# Patient Record
Sex: Female | Born: 1985 | Race: Black or African American | Hispanic: No | Marital: Single | Smoking: Never smoker
Health system: Southern US, Community
[De-identification: ages and names within clinical notes are randomized; demographics above are authoritative.]

## PROBLEM LIST (undated history)

## (undated) DIAGNOSIS — T7840XA Allergy, unspecified, initial encounter: Secondary | ICD-10-CM

## (undated) DIAGNOSIS — J45909 Unspecified asthma, uncomplicated: Secondary | ICD-10-CM

## (undated) DIAGNOSIS — L309 Dermatitis, unspecified: Secondary | ICD-10-CM

## (undated) HISTORY — PX: OTHER SURGICAL HISTORY: SHX169

## (undated) HISTORY — DX: Allergy, unspecified, initial encounter: T78.40XA

## (undated) HISTORY — DX: Unspecified asthma, uncomplicated: J45.909

---
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
## 2006-11-24 ENCOUNTER — Inpatient Hospital Stay (HOSPITAL_COMMUNITY): Admission: AD | Admit: 2006-11-24 | Discharge: 2006-11-25 | Payer: Self-pay | Admitting: Obstetrics

## 2006-12-24 ENCOUNTER — Ambulatory Visit (HOSPITAL_COMMUNITY): Admission: RE | Admit: 2006-12-24 | Discharge: 2006-12-24 | Payer: Self-pay | Admitting: Obstetrics

## 2007-01-20 ENCOUNTER — Ambulatory Visit (HOSPITAL_COMMUNITY): Admission: RE | Admit: 2007-01-20 | Discharge: 2007-01-20 | Payer: Self-pay | Admitting: Obstetrics

## 2007-04-27 ENCOUNTER — Inpatient Hospital Stay (HOSPITAL_COMMUNITY): Admission: AD | Admit: 2007-04-27 | Discharge: 2007-04-29 | Payer: Self-pay | Admitting: Obstetrics

## 2007-07-01 ENCOUNTER — Emergency Department (HOSPITAL_COMMUNITY): Admission: EM | Admit: 2007-07-01 | Discharge: 2007-07-01 | Payer: Self-pay | Admitting: Family Medicine

## 2008-04-22 ENCOUNTER — Inpatient Hospital Stay (HOSPITAL_COMMUNITY): Admission: AD | Admit: 2008-04-22 | Discharge: 2008-04-22 | Payer: Self-pay | Admitting: Obstetrics

## 2008-05-13 ENCOUNTER — Ambulatory Visit (HOSPITAL_COMMUNITY): Admission: RE | Admit: 2008-05-13 | Discharge: 2008-05-13 | Payer: Self-pay | Admitting: Obstetrics & Gynecology

## 2008-09-28 ENCOUNTER — Inpatient Hospital Stay (HOSPITAL_COMMUNITY): Admission: AD | Admit: 2008-09-28 | Discharge: 2008-09-28 | Payer: Self-pay | Admitting: Obstetrics

## 2008-10-10 ENCOUNTER — Inpatient Hospital Stay (HOSPITAL_COMMUNITY): Admission: AD | Admit: 2008-10-10 | Discharge: 2008-10-12 | Payer: Self-pay | Admitting: Obstetrics

## 2010-01-28 ENCOUNTER — Encounter: Payer: Self-pay | Admitting: Obstetrics

## 2010-04-12 LAB — CBC
HCT: 34.4 % — ABNORMAL LOW (ref 36.0–46.0)
Hemoglobin: 11.9 g/dL — ABNORMAL LOW (ref 12.0–15.0)
MCHC: 32.5 g/dL (ref 30.0–36.0)
MCV: 83.4 fL (ref 78.0–100.0)
MCV: 84 fL (ref 78.0–100.0)
Platelets: 120 10*3/uL — ABNORMAL LOW (ref 150–400)
RBC: 4.37 MIL/uL (ref 3.87–5.11)
RDW: 15.3 % (ref 11.5–15.5)
WBC: 8.2 10*3/uL (ref 4.0–10.5)

## 2010-05-25 NOTE — Op Note (Signed)
NAME:  Patricia Mckinney, Patricia Mckinney                       ACCOUNT NO.:  [id number]   MEDICAL RECORD NO.:  [id number]                   PATIENT TYPE:  INP   LOCATION:  9146                                 FACILITY:  [facility]   PHYSICIAN:  Charles A. Sydnee Cabal, MD            DATE OF BIRTH:  1985/02/21   DATE OF PROCEDURE:  09/07/2003  DATE OF DISCHARGE:                                 OPERATIVE REPORT   PROCEDURE:  Delivery.   SURGEON:  Charles A. Sydnee Cabal, M.D.   INDICATIONS:  This is a 25 year old para 0 with an EDC of September 2005 at  39 weeks who presented complaining of onset of contractions about 9 p.m.  She was noted to be 4 cm dilated with intact membranes about 1:30 when she  presented for labor check.  She was in labor and was admitted.  She  progressed on through spontaneous labor.  See full history and physical from  admission note by Dr. Ashley Royalty.   DESCRIPTION OF PROCEDURE:  She progressed onto active labor and became  completely dilated at 0815.  She had spontaneous vaginal delivery after  pushing for about 45 minutes at 10:14.  The placenta followed spontaneously  at 10:25.  She delivered a vigorous female, Apgars 7 and 9.  Cord and arterial  blood gas returned at 7.16.  The venous blood gas was 7.33.  The placenta  was spontaneous, three vessel, intact and right labial lacerations were  noted.  Significant bleeding was noted from these lacerations.  These were  repaired with local anesthetic and 3-0 Vicryl.  Estimated blood loss was 500-  600 mL.  Mother and baby to recovery stable at this time.                                               Charles A. Sydnee Cabal, MD    CAD/MEDQ  D:  09/07/2003  T:  09/07/2003  Job:  [id number]

## 2010-10-02 LAB — CBC
HCT: 35.8 — ABNORMAL LOW
MCHC: 33.9
Platelets: 123 — ABNORMAL LOW
Platelets: 143 — ABNORMAL LOW
RBC: 3.9
RDW: 14.9
RDW: 15.3

## 2010-10-04 LAB — POCT RAPID STREP A: Streptococcus, Group A Screen (Direct): POSITIVE — AB

## 2010-10-16 LAB — URINALYSIS, ROUTINE W REFLEX MICROSCOPIC
Ketones, ur: NEGATIVE
Nitrite: NEGATIVE
Protein, ur: NEGATIVE
Urobilinogen, UA: 0.2
pH: 6

## 2014-10-05 ENCOUNTER — Ambulatory Visit (INDEPENDENT_AMBULATORY_CARE_PROVIDER_SITE_OTHER): Payer: Managed Care, Other (non HMO) | Admitting: Physician Assistant

## 2014-10-05 VITALS — BP 118/64 | HR 91 | Temp 98.4°F | Resp 18 | Ht 62.0 in | Wt 186.0 lb

## 2014-10-05 DIAGNOSIS — J029 Acute pharyngitis, unspecified: Secondary | ICD-10-CM

## 2014-10-05 LAB — POCT RAPID STREP A (OFFICE): Rapid Strep A Screen: NEGATIVE

## 2014-10-05 MED ORDER — MAGIC MOUTHWASH W/LIDOCAINE: 10.0000 mL | ORAL | Status: DC | PRN

## 2014-10-05 NOTE — Progress Notes (Signed)
   Subjective:    Patient ID: Patricia Mckinney, female    DOB: 12-Dec-1985, 29 y.o.   MRN: [id number]  HPI Patient presents for sore throat and myalgias that have been present for the past 2 days. Able to eat and swallow without difficulty. Additionally endorses rhinorrhea, congestion, and a little cough. Denies fever, N/V, HA, SOB, CP, wheezing, or otalgia. H/o asthma and allergies that are currently controlled. Has not tried any OTC meds for relief. Has never smoked. Has 3 younger sons in the home and wanted to make sure she did not have strep throat. NKDA.   Review of Systems As noted above.    Objective:   Physical Exam  Constitutional: She is oriented to person, place, and time. She appears well-developed and well-nourished. No distress.  Blood pressure 118/64, pulse 91, temperature 98.4 F (36.9 C), temperature source Oral, resp. rate 18, height  (1.575 m), weight 186 lb (84.369 kg), last menstrual period 09/28/2014, SpO2 99 %.   HENT:  Head: Normocephalic and atraumatic.  Right Ear: Tympanic membrane, external ear and ear canal normal.  Left Ear: Tympanic membrane, external ear and ear canal normal.  Nose: Rhinorrhea (with erythema) present. Right sinus exhibits no maxillary sinus tenderness and no frontal sinus tenderness. Left sinus exhibits no maxillary sinus tenderness and no frontal sinus tenderness.  Mouth/Throat: Uvula is midline and mucous membranes are normal. Posterior oropharyngeal erythema present. No oropharyngeal exudate or posterior oropharyngeal edema.  Tonsils 1+ hypertrophic bilaterally without exudate.  Eyes: Conjunctivae are normal. Pupils are equal, round, and reactive to light. Right eye exhibits no discharge. Left eye exhibits no discharge. No scleral icterus.  Neck: Normal range of motion. Neck supple. No thyromegaly present.  Cardiovascular: Normal rate, regular rhythm and normal heart sounds.  Exam reveals no gallop and no friction rub.   No murmur  heard. Pulmonary/Chest: Effort normal and breath sounds normal. No respiratory distress. She has no decreased breath sounds. She has no wheezes. She has no rhonchi. She has no rales.  Abdominal: Soft. Bowel sounds are normal. She exhibits no distension. There is no tenderness. There is no rebound and no guarding.  Lymphadenopathy:    She has no cervical adenopathy.  Neurological: She is alert and oriented to person, place, and time.  Skin: Skin is warm and dry. No rash noted. She is not diaphoretic. No erythema.   Results for orders placed or performed in visit on 10/05/14  POCT rapid strep A  Result Value Ref Range   Rapid Strep A Screen Negative Negative       Assessment & Plan:  1. Sore throat 2. Acute pharyngitis, unspecified pharyngitis type Can use ibuprofen for pain as well.  - POCT rapid strep A - Culture, Group A Strep - magic mouthwash w/lidocaine SOLN; Take 10 mLs by mouth every 2 (two) hours as needed for mouth pain.  Dispense: 120 mL; Refill: 0   Tishira Brewington PA-C  [facility] 10/05/2014 1:05 PM

## 2014-10-07 LAB — CULTURE, GROUP A STREP: ORGANISM ID, BACTERIA: NORMAL

## 2014-10-10 ENCOUNTER — Encounter: Payer: Self-pay | Admitting: Physician Assistant

## 2015-01-24 ENCOUNTER — Other Ambulatory Visit: Payer: Self-pay | Admitting: Physician Assistant

## 2015-01-24 ENCOUNTER — Other Ambulatory Visit (HOSPITAL_COMMUNITY)
Admission: RE | Admit: 2015-01-24 | Discharge: 2015-01-24 | Disposition: A | Discharge status: Home / Self Care | Payer: Self-pay | Source: Ambulatory Visit | Attending: Physician Assistant | Admitting: Physician Assistant

## 2015-01-24 DIAGNOSIS — Z01411 Encounter for gynecological examination (general) (routine) with abnormal findings: Secondary | ICD-10-CM | POA: Insufficient documentation

## 2015-01-30 LAB — CYTOLOGY - PAP

## 2016-03-26 ENCOUNTER — Emergency Department (HOSPITAL_COMMUNITY)
Admission: EM | Admit: 2016-03-26 | Discharge: 2016-03-26 | Disposition: A | Discharge status: Home / Self Care | Payer: BLUE CROSS/BLUE SHIELD | Attending: Emergency Medicine | Admitting: Emergency Medicine

## 2016-03-26 ENCOUNTER — Encounter (HOSPITAL_COMMUNITY): Payer: Self-pay | Admitting: Family Medicine

## 2016-03-26 DIAGNOSIS — Y999 Unspecified external cause status: Secondary | ICD-10-CM | POA: Diagnosis not present

## 2016-03-26 DIAGNOSIS — Y92410 Unspecified street and highway as the place of occurrence of the external cause: Secondary | ICD-10-CM | POA: Insufficient documentation

## 2016-03-26 DIAGNOSIS — M62838 Other muscle spasm: Secondary | ICD-10-CM

## 2016-03-26 DIAGNOSIS — J45909 Unspecified asthma, uncomplicated: Secondary | ICD-10-CM | POA: Insufficient documentation

## 2016-03-26 DIAGNOSIS — M5489 Other dorsalgia: Secondary | ICD-10-CM | POA: Diagnosis not present

## 2016-03-26 DIAGNOSIS — T148XXA Other injury of unspecified body region, initial encounter: Secondary | ICD-10-CM | POA: Diagnosis not present

## 2016-03-26 DIAGNOSIS — Z79899 Other long term (current) drug therapy: Secondary | ICD-10-CM | POA: Diagnosis not present

## 2016-03-26 DIAGNOSIS — Y939 Activity, unspecified: Secondary | ICD-10-CM | POA: Diagnosis not present

## 2016-03-26 DIAGNOSIS — M545 Low back pain: Secondary | ICD-10-CM | POA: Diagnosis present

## 2016-03-26 MED ORDER — METHOCARBAMOL 500 MG PO TABS: 500.0000 mg | ORAL_TABLET | Freq: Four times a day (QID) | ORAL | 0 refills | Status: DC | PRN
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
## 2016-07-08 ENCOUNTER — Emergency Department (HOSPITAL_COMMUNITY)
Admission: EM | Admit: 2016-07-08 | Discharge: 2016-07-08 | Disposition: A | Discharge status: Home / Self Care | Payer: BLUE CROSS/BLUE SHIELD | Attending: Emergency Medicine | Admitting: Emergency Medicine

## 2016-07-08 ENCOUNTER — Emergency Department (HOSPITAL_COMMUNITY): Payer: BLUE CROSS/BLUE SHIELD

## 2016-07-08 ENCOUNTER — Encounter (HOSPITAL_COMMUNITY): Payer: Self-pay

## 2016-07-08 DIAGNOSIS — K219 Gastro-esophageal reflux disease without esophagitis: Secondary | ICD-10-CM | POA: Insufficient documentation

## 2016-07-08 DIAGNOSIS — R110 Nausea: Secondary | ICD-10-CM

## 2016-07-08 DIAGNOSIS — R1011 Right upper quadrant pain: Secondary | ICD-10-CM

## 2016-07-08 DIAGNOSIS — R112 Nausea with vomiting, unspecified: Secondary | ICD-10-CM | POA: Diagnosis not present

## 2016-07-08 DIAGNOSIS — J45909 Unspecified asthma, uncomplicated: Secondary | ICD-10-CM | POA: Diagnosis not present

## 2016-07-08 DIAGNOSIS — R1013 Epigastric pain: Secondary | ICD-10-CM | POA: Diagnosis present

## 2016-07-08 DIAGNOSIS — K824 Cholesterolosis of gallbladder: Secondary | ICD-10-CM | POA: Insufficient documentation

## 2016-07-08 LAB — COMPREHENSIVE METABOLIC PANEL
ALBUMIN: 3.9 g/dL (ref 3.5–5.0)
ALT: 20 U/L (ref 14–54)
ANION GAP: 8 (ref 5–15)
AST: 23 U/L (ref 15–41)
Alkaline Phosphatase: 52 U/L (ref 38–126)
BUN: 7 mg/dL (ref 6–20)
CHLORIDE: 105 mmol/L (ref 101–111)
CO2: 27 mmol/L (ref 22–32)
Calcium: 9.7 mg/dL (ref 8.9–10.3)
Creatinine, Ser: 0.73 mg/dL (ref 0.44–1.00)
GFR calc Af Amer: >60 mL/min (ref >60)
Glucose, Bld: 100 mg/dL — ABNORMAL HIGH (ref 65–99)
POTASSIUM: 3.9 mmol/L (ref 3.5–5.1)
Sodium: 140 mmol/L (ref 135–145)
Total Bilirubin: 0.6 mg/dL (ref 0.3–1.2)
Total Protein: 7.5 g/dL (ref 6.5–8.1)

## 2016-07-08 LAB — URINALYSIS, ROUTINE W REFLEX MICROSCOPIC
Bilirubin Urine: NEGATIVE
GLUCOSE, UA: NEGATIVE mg/dL
Hgb urine dipstick: NEGATIVE
KETONES UR: NEGATIVE mg/dL
LEUKOCYTES UA: NEGATIVE
Nitrite: NEGATIVE
PH: 5 (ref 5.0–8.0)
Protein, ur: NEGATIVE mg/dL
SPECIFIC GRAVITY, URINE: 1.011 (ref 1.005–1.030)

## 2016-07-08 LAB — CBC
HEMATOCRIT: 39.6 % (ref 36.0–46.0)
HEMOGLOBIN: 12.8 g/dL (ref 12.0–15.0)
MCH: 25.4 pg — ABNORMAL LOW (ref 26.0–34.0)
MCHC: 32.3 g/dL (ref 30.0–36.0)
MCV: 78.7 fL (ref 78.0–100.0)
Platelets: 229 10*3/uL (ref 150–400)
RBC: 5.03 MIL/uL (ref 3.87–5.11)
RDW: 14.2 % (ref 11.5–15.5)
WBC: 6.3 10*3/uL (ref 4.0–10.5)

## 2016-07-08 LAB — I-STAT BETA HCG BLOOD, ED (MC, WL, AP ONLY): I-stat hCG, quantitative: <5 m[IU]/mL (ref <5)

## 2016-07-08 LAB — LIPASE, BLOOD: LIPASE: 25 U/L (ref 11–51)

## 2016-07-08 MED ORDER — GI COCKTAIL ~~LOC~~
30.0000 mL | Freq: Once | ORAL | Status: AC
  Administered 2016-07-08: 30 mL via ORAL
  Filled 2016-07-08: qty 30

## 2016-07-08 MED ORDER — ONDANSETRON HCL 4 MG/2ML IJ SOLN
4.0000 mg | Freq: Once | INTRAMUSCULAR | Status: AC
  Administered 2016-07-08: 4 mg via INTRAVENOUS
  Filled 2016-07-08: qty 2

## 2016-07-08 MED ORDER — OMEPRAZOLE 20 MG PO CPDR: 20.0000 mg | DELAYED_RELEASE_CAPSULE | Freq: Every day | ORAL | 0 refills | Status: AC

## 2016-07-08 MED ORDER — ONDANSETRON 4 MG PO TBDP: 4.0000 mg | ORAL_TABLET | Freq: Three times a day (TID) | ORAL | 0 refills | Status: AC | PRN

## 2016-07-08 MED ORDER — SODIUM CHLORIDE 0.9 % IV BOLUS (SEPSIS)
1000.0000 mL | Freq: Once | INTRAVENOUS | Status: AC
  Administered 2016-07-08: 1000 mL via INTRAVENOUS

## 2016-07-08 MED ORDER — ACETAMINOPHEN 325 MG PO TABS: 650.0000 mg | ORAL_TABLET | Freq: Four times a day (QID) | ORAL | 0 refills | Status: AC | PRN

## 2016-07-08 NOTE — ED Provider Notes (Signed)
[facility]-EMERGENCY DEPT Provider Note   CSN: [id number] Arrival date & time: 07/08/16  0759     History   Chief Complaint Chief Complaint  Patient presents with  . Emesis    HPI Patricia Mckinney is a 31 y.o. female.  Patricia Mitchellequila S Harriott is a 31 y.o. Female who presents to the emergency department complaining of intermittent epigastric abdominal pain with associated nausea and vomiting for about 3 weeks. Patient reports 3 weeks of intermittent epigastric abdominal pain. She reports her pain comes randomly is not usually associated with eating or drinking. She reports that typically last for about 2 hours and then resolves. She often has nausea associated with it. Occasionally she vomits, she denies any recent vomiting. She denies previous abdominal surgeries. She took a laxative yesterday and had 3 normal bowel movements. She denies any constipation recently. She denies previous abdominal surgeries. She denies fevers, hematemesis, diarrhea, hematochezia, urinary symptoms, chest pain, shortness of breath or rashes.   The history is provided by the patient and medical records. No language interpreter was used.  Emesis   Associated symptoms include abdominal pain. Pertinent negatives include no chills, no cough, no diarrhea, no fever and no headaches.    Past Medical History:  Diagnosis Date  . Allergy   . Asthma     There are no active problems to display for this patient.   History reviewed. No pertinent surgical history.  OB History    No data available       Home Medications    Prior to Admission medications   Medication Sig Start Date End Date Taking? Authorizing Provider  acetaminophen (TYLENOL) 325 MG tablet Take 2 tablets (650 mg total) by mouth every 6 (six) hours as needed for mild pain or moderate pain. 07/08/16   Everlene Farrieransie, Ishmel Acevedo, PA-C  magic mouthwash w/lidocaine SOLN Take 10 mLs by mouth every 2 (two) hours as needed for mouth pain. 10/05/14   Brewington, Tishira R,  PA-C  methocarbamol (ROBAXIN) 500 MG tablet Take 1 tablet (500 mg total) by mouth every 6 (six) hours as needed for muscle spasms. 03/26/16   Liberty HandyGibbons, Claudia J, PA-C  omeprazole (PRILOSEC) 20 MG capsule Take 1 capsule (20 mg total) by mouth daily. 07/08/16   Everlene Farrieransie, Aarush Stukey, PA-C  ondansetron (ZOFRAN ODT) 4 MG disintegrating tablet Take 1 tablet (4 mg total) by mouth every 8 (eight) hours as needed for nausea or vomiting. 07/08/16   Everlene Farrieransie, Woods Gangemi, PA-C    Family History Family History  Problem Relation Age of Onset  . Hypertension Maternal Grandmother   . Hyperlipidemia Maternal Grandmother   . Diabetes Maternal Grandmother     Social History Social History  Substance Use Topics  . Smoking status: Never Smoker  . Smokeless tobacco: Never Used  . Alcohol use No     Allergies   Shellfish allergy   Review of Systems Review of Systems  Constitutional: Negative for chills and fever.  HENT: Negative for congestion and sore throat.   Eyes: Negative for visual disturbance.  Respiratory: Negative for cough and shortness of breath.   Cardiovascular: Negative for chest pain.  Gastrointestinal: Positive for abdominal pain, nausea and vomiting. Negative for abdominal distention and diarrhea.  Genitourinary: Negative for difficulty urinating, dysuria, flank pain, frequency, hematuria, pelvic pain, urgency, vaginal bleeding and vaginal discharge.  Musculoskeletal: Negative for back pain and neck pain.  Skin: Negative for rash.  Neurological: Negative for light-headedness and headaches.     Physical Exam Updated Vital Signs BP  118/81   Pulse 81   Temp 98.4 F (36.9 C) (Oral)   Resp 18   Ht 5\' 2"  (1.575 m)   Wt 86.2 kg (190 lb)   LMP 06/08/2016   SpO2 100%   BMI 34.75 kg/m   Physical Exam  Constitutional: She appears well-developed and well-nourished. No distress.  Nontoxic appearing.  HENT:  Head: Normocephalic and atraumatic.  Mouth/Throat: Oropharynx is clear and moist.    Eyes: Conjunctivae are normal. Pupils are equal, round, and reactive to light. Right eye exhibits no discharge. Left eye exhibits no discharge.  Neck: Neck supple.  Cardiovascular: Normal rate, regular rhythm, normal heart sounds and intact distal pulses.  Exam reveals no gallop and no friction rub.   No murmur heard. Pulmonary/Chest: Effort normal and breath sounds normal. No respiratory distress. She has no wheezes. She has no rales.  Abdominal: Soft. Bowel sounds are normal. She exhibits no distension and no mass. There is tenderness. There is no rebound and no guarding.  Abdomen is soft. Bowel sounds are present. Patient has mild epigastric and right upper quadrant abdominal tenderness to palpation. No peritoneal signs. No lower abdominal tenderness. No CVA or flank tenderness.  Musculoskeletal: She exhibits no edema.  Lymphadenopathy:    She has no cervical adenopathy.  Neurological: She is alert. Coordination normal.  Skin: Skin is warm and dry. No rash noted. She is not diaphoretic. No erythema. No pallor.  Psychiatric: She has a normal mood and affect. Her behavior is normal.  Nursing note and vitals reviewed.    ED Treatments / Results  Labs (all labs ordered are listed, but only abnormal results are displayed) Labs Reviewed  COMPREHENSIVE METABOLIC PANEL - Abnormal; Notable for the following:       Result Value   Glucose, Bld 100 (*)    All other components within normal limits  CBC - Abnormal; Notable for the following:    MCH 25.4 (*)    All other components within normal limits  LIPASE, BLOOD  URINALYSIS, ROUTINE W REFLEX MICROSCOPIC  I-STAT BETA HCG BLOOD, ED ([facility], [facility], [facility] ONLY)    EKG  EKG Interpretation None       Radiology US Abdomen Limited  Result Date: 07/08/2016 CLINICAL DATA:  Right upper quadrant pain. EXAM: ULTRASOUND ABDOMEN LIMITED RIGHT UPPER QUADRANT COMPARISON:  No recent prior dominant ultrasound. FINDINGS: Gallbladder: 3 tiny polyps in the  gallbladder, largest measures 3 mm. No gallstones. Gallbladder wall thickness normal. Negative Murphy sign . Common bile duct: Diameter: 4.5 mm Liver: No focal lesion identified. Within normal limits in parenchymal echogenicity. IMPRESSION: Tiny gallbladder polyps, the largest measuring 3 mm, otherwise negative exam . Electronically Signed   By: Maisie Fus  Register   On: 07/08/2016 09:38    Procedures Procedures (including critical care time)  Medications Ordered in ED Medications  sodium chloride 0.9 % bolus 1,000 mL (0 mLs Intravenous Stopped 07/08/16 0956)  ondansetron (ZOFRAN) injection 4 mg (4 mg Intravenous Given 07/08/16 0907)  gi cocktail (Maalox,Lidocaine,Donnatal) (30 mLs Oral Given 07/08/16 0907)     Initial Impression / Assessment and Plan / ED Course  I have reviewed the triage vital signs and the nursing notes.  Pertinent labs & imaging results that were available during my care of the patient were reviewed by me and considered in my medical decision making (see chart for details).     This is a 31 y.o. Female who presents to the emergency department complaining of intermittent epigastric abdominal pain with associated  nausea and vomiting for about 3 weeks. Patient reports 3 weeks of intermittent epigastric abdominal pain. She reports her pain comes randomly is not usually associated with eating or drinking. She reports that typically last for about 2 hours and then resolves. She often has nausea associated with it. Occasionally she vomits, she denies any recent vomiting. She denies previous abdominal surgeries.  On exam patient is afebrile and nontoxic appearing. She has a mild epigastric and right upper quadrant tenderness to palpation. No peritoneal signs. Pregnancy test is negative. Urinalysis without signs of infection. Lipase is within normal limits. CMP is unremarkable. Normal liver enzymes. CBC is unremarkable. No leukocytosis. Right upper quadrant ultrasound reveals tiny  gallbladder polyps, the largest measuring 3 mm., Otherwise negative exam. At reevaluation patient reports feeling much better after GI cocktail. She is tolerating by mouth without nausea or vomiting. Suspect reflux versus biliary colic. No evidence of cholecystitis or cholelithiasis. Will have the patient start omeprazole. We'll also discharge with Tylenol and Zofran. We'll have her follow-up with general surgery as an outpatient for possible HIDA scan. Diet instructions discussed. Return precautions discussed. I advised the patient to follow-up with their primary care provider this week. I advised the patient to return to the emergency department with new or worsening symptoms or new concerns. The patient verbalized understanding and agreement with plan.      Final Clinical Impressions(s) / ED Diagnoses   Final diagnoses:  Gallbladder polyp  RUQ abdominal pain  Nausea  Gastroesophageal reflux disease, esophagitis presence not specified    New Prescriptions New Prescriptions   ACETAMINOPHEN (TYLENOL) 325 MG TABLET    Take 2 tablets (650 mg total) by mouth every 6 (six) hours as needed for mild pain or moderate pain.   OMEPRAZOLE (PRILOSEC) 20 MG CAPSULE    Take 1 capsule (20 mg total) by mouth daily.   ONDANSETRON (ZOFRAN ODT) 4 MG DISINTEGRATING TABLET    Take 1 tablet (4 mg total) by mouth every 8 (eight) hours as needed for nausea or vomiting.     Everlene Farrier, PA-C 07/08/16 1021    Tegeler, Canary Brim, MD 07/08/16 [phone]

## 2016-07-08 NOTE — ED Triage Notes (Signed)
Per Pt, Pt is coming from home with complaints of three weeks of nausea, vomiting, and cold chills that is recurrent. Pt reports taking laxatives and having three good bowel movements yesterday. Denies urinary symptoms and vaginal discharge.

## 2017-06-10 ENCOUNTER — Other Ambulatory Visit (HOSPITAL_COMMUNITY)
Admission: RE | Admit: 2017-06-10 | Discharge: 2017-06-10 | Disposition: A | Discharge status: Home / Self Care | Payer: BLUE CROSS/BLUE SHIELD | Source: Ambulatory Visit | Attending: Family Medicine | Admitting: Family Medicine

## 2017-06-10 ENCOUNTER — Other Ambulatory Visit: Payer: Self-pay | Admitting: Physician Assistant

## 2017-06-10 DIAGNOSIS — J45909 Unspecified asthma, uncomplicated: Secondary | ICD-10-CM | POA: Diagnosis not present

## 2017-06-10 DIAGNOSIS — Z01419 Encounter for gynecological examination (general) (routine) without abnormal findings: Secondary | ICD-10-CM | POA: Insufficient documentation

## 2017-06-10 DIAGNOSIS — Z124 Encounter for screening for malignant neoplasm of cervix: Secondary | ICD-10-CM | POA: Diagnosis not present

## 2017-06-10 DIAGNOSIS — Z13220 Encounter for screening for lipoid disorders: Secondary | ICD-10-CM | POA: Diagnosis not present

## 2017-06-10 DIAGNOSIS — Z113 Encounter for screening for infections with a predominantly sexual mode of transmission: Secondary | ICD-10-CM | POA: Diagnosis not present

## 2017-06-10 DIAGNOSIS — Z0000 Encounter for general adult medical examination without abnormal findings: Secondary | ICD-10-CM | POA: Diagnosis not present

## 2017-06-10 DIAGNOSIS — Z23 Encounter for immunization: Secondary | ICD-10-CM | POA: Diagnosis not present

## 2017-06-12 LAB — CYTOLOGY - PAP: Diagnosis: NEGATIVE

## 2017-07-18 DIAGNOSIS — B354 Tinea corporis: Secondary | ICD-10-CM | POA: Diagnosis not present

## 2017-09-26 DIAGNOSIS — R21 Rash and other nonspecific skin eruption: Secondary | ICD-10-CM | POA: Diagnosis not present

## 2017-10-08 DIAGNOSIS — L42 Pityriasis rosea: Secondary | ICD-10-CM | POA: Diagnosis not present

## 2017-11-07 DIAGNOSIS — L42 Pityriasis rosea: Secondary | ICD-10-CM | POA: Diagnosis not present

## 2017-11-20 DIAGNOSIS — H109 Unspecified conjunctivitis: Secondary | ICD-10-CM | POA: Diagnosis not present

## 2017-12-19 DIAGNOSIS — H20013 Primary iridocyclitis, bilateral: Secondary | ICD-10-CM | POA: Diagnosis not present

## 2017-12-24 DIAGNOSIS — H20013 Primary iridocyclitis, bilateral: Secondary | ICD-10-CM | POA: Diagnosis not present

## 2018-01-02 DIAGNOSIS — H469 Unspecified optic neuritis: Secondary | ICD-10-CM | POA: Diagnosis not present

## 2018-01-02 DIAGNOSIS — H20013 Primary iridocyclitis, bilateral: Secondary | ICD-10-CM | POA: Diagnosis not present

## 2018-01-06 DIAGNOSIS — H401131 Primary open-angle glaucoma, bilateral, mild stage: Secondary | ICD-10-CM | POA: Diagnosis not present

## 2018-01-09 ENCOUNTER — Other Ambulatory Visit: Payer: Self-pay | Admitting: Ophthalmology

## 2018-01-14 ENCOUNTER — Other Ambulatory Visit: Payer: Self-pay | Admitting: Ophthalmology

## 2018-01-23 ENCOUNTER — Other Ambulatory Visit: Payer: Self-pay | Admitting: Ophthalmology

## 2018-01-23 DIAGNOSIS — H4710 Unspecified papilledema: Secondary | ICD-10-CM

## 2018-02-02 DIAGNOSIS — H401131 Primary open-angle glaucoma, bilateral, mild stage: Secondary | ICD-10-CM | POA: Diagnosis not present

## 2018-02-12 ENCOUNTER — Other Ambulatory Visit: Payer: Self-pay

## 2018-09-10 DIAGNOSIS — Z20828 Contact with and (suspected) exposure to other viral communicable diseases: Secondary | ICD-10-CM | POA: Diagnosis not present

## 2018-09-21 DIAGNOSIS — Z20828 Contact with and (suspected) exposure to other viral communicable diseases: Secondary | ICD-10-CM | POA: Diagnosis not present

## 2018-12-01 DIAGNOSIS — Z20828 Contact with and (suspected) exposure to other viral communicable diseases: Secondary | ICD-10-CM | POA: Diagnosis not present

## 2018-12-08 DIAGNOSIS — Z20828 Contact with and (suspected) exposure to other viral communicable diseases: Secondary | ICD-10-CM | POA: Diagnosis not present

## 2018-12-17 DIAGNOSIS — Z20828 Contact with and (suspected) exposure to other viral communicable diseases: Secondary | ICD-10-CM | POA: Diagnosis not present

## 2019-07-11 ENCOUNTER — Other Ambulatory Visit: Payer: Self-pay

## 2019-07-11 ENCOUNTER — Emergency Department (HOSPITAL_COMMUNITY): Payer: Self-pay

## 2019-07-11 ENCOUNTER — Emergency Department (HOSPITAL_COMMUNITY)
Admission: EM | Admit: 2019-07-11 | Discharge: 2019-07-11 | Disposition: A | Discharge status: Home / Self Care | Payer: Self-pay | Attending: Emergency Medicine | Admitting: Emergency Medicine

## 2019-07-11 ENCOUNTER — Encounter (HOSPITAL_COMMUNITY): Payer: Self-pay | Admitting: Emergency Medicine

## 2019-07-11 DIAGNOSIS — B9689 Other specified bacterial agents as the cause of diseases classified elsewhere: Secondary | ICD-10-CM

## 2019-07-11 DIAGNOSIS — Z79899 Other long term (current) drug therapy: Secondary | ICD-10-CM | POA: Insufficient documentation

## 2019-07-11 DIAGNOSIS — J45909 Unspecified asthma, uncomplicated: Secondary | ICD-10-CM | POA: Insufficient documentation

## 2019-07-11 DIAGNOSIS — N760 Acute vaginitis: Secondary | ICD-10-CM | POA: Insufficient documentation

## 2019-07-11 DIAGNOSIS — R1032 Left lower quadrant pain: Secondary | ICD-10-CM

## 2019-07-11 DIAGNOSIS — R399 Unspecified symptoms and signs involving the genitourinary system: Secondary | ICD-10-CM

## 2019-07-11 LAB — COMPREHENSIVE METABOLIC PANEL
ALT: 14 U/L (ref 0–44)
AST: 16 U/L (ref 15–41)
Albumin: 3.6 g/dL (ref 3.5–5.0)
Alkaline Phosphatase: 37 U/L — ABNORMAL LOW (ref 38–126)
Anion gap: 7 (ref 5–15)
BUN: 7 mg/dL (ref 6–20)
CO2: 24 mmol/L (ref 22–32)
Calcium: 8.9 mg/dL (ref 8.9–10.3)
Chloride: 107 mmol/L (ref 98–111)
Creatinine, Ser: 0.77 mg/dL (ref 0.44–1.00)
GFR calc Af Amer: >60 mL/min (ref >60)
GFR calc non Af Amer: >60 mL/min (ref >60)
Glucose, Bld: 132 mg/dL — ABNORMAL HIGH (ref 70–99)
Potassium: 3.3 mmol/L — ABNORMAL LOW (ref 3.5–5.1)
Sodium: 138 mmol/L (ref 135–145)
Total Bilirubin: 0.6 mg/dL (ref 0.3–1.2)
Total Protein: 6.7 g/dL (ref 6.5–8.1)

## 2019-07-11 LAB — URINALYSIS, ROUTINE W REFLEX MICROSCOPIC
Bilirubin Urine: NEGATIVE
Glucose, UA: NEGATIVE mg/dL
Ketones, ur: NEGATIVE mg/dL
Leukocytes,Ua: NEGATIVE
Nitrite: NEGATIVE
Protein, ur: NEGATIVE mg/dL
RBC / HPF: >50 RBC/hpf — ABNORMAL HIGH (ref 0–5)
Specific Gravity, Urine: 1.018 (ref 1.005–1.030)
pH: 5 (ref 5.0–8.0)

## 2019-07-11 LAB — I-STAT BETA HCG BLOOD, ED (MC, WL, AP ONLY): I-stat hCG, quantitative: <5 m[IU]/mL (ref <5)

## 2019-07-11 LAB — CBC
HCT: 38.8 % (ref 36.0–46.0)
Hemoglobin: 12.3 g/dL (ref 12.0–15.0)
MCH: 25.6 pg — ABNORMAL LOW (ref 26.0–34.0)
MCHC: 31.7 g/dL (ref 30.0–36.0)
MCV: 80.8 fL (ref 80.0–100.0)
Platelets: 202 10*3/uL (ref 150–400)
RBC: 4.8 MIL/uL (ref 3.87–5.11)
RDW: 14.5 % (ref 11.5–15.5)
WBC: 7.3 10*3/uL (ref 4.0–10.5)
nRBC: 0 % (ref 0.0–0.2)

## 2019-07-11 LAB — WET PREP, GENITAL
Sperm: NONE SEEN
Trich, Wet Prep: NONE SEEN
Yeast Wet Prep HPF POC: NONE SEEN

## 2019-07-11 LAB — LIPASE, BLOOD: Lipase: 20 U/L (ref 11–51)

## 2019-07-11 MED ORDER — METRONIDAZOLE 500 MG PO TABS
500.0000 mg | ORAL_TABLET | Freq: Two times a day (BID) | ORAL | 0 refills | Status: DC
Start: 2019-07-11 — End: 2020-04-09

## 2019-07-11 MED ORDER — SODIUM CHLORIDE 0.9% FLUSH: 3.0000 mL | Freq: Once | INTRAVENOUS | Status: DC

## 2019-07-11 MED ORDER — CEPHALEXIN 500 MG PO CAPS
500.0000 mg | ORAL_CAPSULE | Freq: Four times a day (QID) | ORAL | 0 refills | Status: AC
Start: 2019-07-11 — End: 2019-07-18

## 2019-07-11 MED ORDER — POTASSIUM CHLORIDE CRYS ER 20 MEQ PO TBCR
40.0000 meq | EXTENDED_RELEASE_TABLET | Freq: Once | ORAL | Status: AC
  Administered 2019-07-11: 40 meq via ORAL
  Filled 2019-07-11: qty 2

## 2019-07-11 NOTE — ED Notes (Signed)
Patient transported to Ultrasound 

## 2019-07-11 NOTE — ED Triage Notes (Signed)
Pt c/o LLQ abd pain for the past 2 hours, denies fever, chills, nausea or vomiting.

## 2019-07-11 NOTE — ED Notes (Signed)
Pelvic cart at bedside. 

## 2019-07-11 NOTE — Discharge Instructions (Signed)
Please take all of your antibiotics until finished!   Take your antibiotics with food.  Common side effects of antibiotics include nausea, vomiting, abdominal discomfort, and diarrhea. You may help offset some of this with probiotics which you can buy or get in yogurt. Do not eat  or take the probiotics until 2 hours after your antibiotic.    Some studies suggest that certain antibiotics can reduce the efficacy of certain oral contraceptive pills (birth control), so please use additional contraceptives (condoms or other barrier method) while you are taking the antibiotics and for an additional 5 to 7 days afterwards if you are a female on these medications.  You can take 1 to 2 tablets of Tylenol (350mg -1000mg  depending on the dose) every 6 hours as needed for pain.  Do not exceed 4000 mg of Tylenol daily.  If your pain persists you can take a doses of ibuprofen in between doses of Tylenol.  I usually recommend 400 to 600 mg of ibuprofen every 6 hours.  Take this with food to avoid upset stomach issues.  Follow-up with primary care provider for reevaluation of symptoms.  Return to the emergency department if any concerning signs or symptoms develop such as fevers, worsening pain, persistent vomiting.

## 2019-07-11 NOTE — ED Provider Notes (Signed)
[facility] EMERGENCY DEPARTMENT Provider Note   CSN: [id number] Arrival date & time: 07/11/19  8185     History Chief Complaint  Patient presents with  . Abdominal Pain    Patricia Mckinney is a 34 y.o. female with history of allergies, asthma presents for evaluation of acute onset, resolved abdominal pain lasting for 3 hours.  She reports that she noticed the pain when she awoke early this morning to go to the bathroom.  The pain is dull, severe, constant and located along the left lower quadrant of the abdomen radiating to the back.  No aggravating or alleviating factors noted.  She had known nausea but had one episode of nonbloody nonbilious emesis this morning while in the waiting room.  She took ibuprofen and Tylenol at home without relief of symptoms.  She did note when she went to urinate that she had a little bit of a difficult time starting her stream and that her urine was darker but she attributed that to dehydration.  Denies diarrhea, constipation, melena, hematochezia, fevers, chills, vaginal itching, bleeding, or discharge out of the ordinary.  No history of kidney stones.  She states that suddenly about 45 minutes prior to my assessment the pain completely resolved and she is currently pain-free.   The history is provided by the patient.       Past Medical History:  Diagnosis Date  . Allergy   . Asthma     There are no problems to display for this patient.   History reviewed. No pertinent surgical history.   OB History   No obstetric history on file.     Family History  Problem Relation Age of Onset  . Hypertension Maternal Grandmother   . Hyperlipidemia Maternal Grandmother   . Diabetes Maternal Grandmother     Social History   Tobacco Use  . Smoking status: Never Smoker  . Smokeless tobacco: Never Used  Substance Use Topics  . Alcohol use: No    Alcohol/week: 0.0 standard drinks  . Drug use: No    Home Medications Prior to  Admission medications   Medication Sig Start Date End Date Taking? Authorizing Provider  acetaminophen (TYLENOL) 500 MG tablet Take 1,000 mg by mouth every 6 (six) hours as needed for mild pain.   Yes [provider]  albuterol (VENTOLIN HFA) 108 (90 Base) MCG/ACT inhaler Inhale 2 puffs into the lungs every 4 (four) hours as needed for wheezing or shortness of breath.   Yes [provider]  cetirizine (ZYRTEC) 10 MG tablet Take 10 mg by mouth daily.   Yes [provider]  Multiple Vitamin (MULTIVITAMIN WITH MINERALS) TABS tablet Take 1 tablet by mouth daily.   Yes [provider]  omeprazole (PRILOSEC) 20 MG capsule Take 1 capsule (20 mg total) by mouth daily. 07/08/16  Yes Everlene Farrier, PA-C  acetaminophen (TYLENOL) 325 MG tablet Take 2 tablets (650 mg total) by mouth every 6 (six) hours as needed for mild pain or moderate pain. Patient not taking: Reported on 07/11/2019 07/08/16   Everlene Farrier, PA-C  cephALEXin (KEFLEX) 500 MG capsule Take 1 capsule (500 mg total) by mouth 4 (four) times daily for 7 days. 07/11/19 07/18/19  Michela Pitcher A, PA-C  magic mouthwash w/lidocaine SOLN Take 10 mLs by mouth every 2 (two) hours as needed for mouth pain. Patient not taking: Reported on 07/11/2019 10/05/14   B[place]ington, Tishira R, PA-C  methocarbamol (ROBAXIN) 500 MG tablet Take 1 tablet (500 mg  total) by mouth every 6 (six) hours as needed for muscle spasms. Patient not taking: Reported on 07/11/2019 03/26/16   Liberty Handy, PA-C  metroNIDAZOLE (FLAGYL) 500 MG tablet Take 1 tablet (500 mg total) by mouth 2 (two) times daily. 07/11/19   Adrian Specht A, PA-C  ondansetron (ZOFRAN ODT) 4 MG disintegrating tablet Take 1 tablet (4 mg total) by mouth every 8 (eight) hours as needed for nausea or vomiting. Patient not taking: Reported on 07/11/2019 07/08/16   Everlene Farrier, PA-C    Allergies    Shellfish allergy  Review of Systems   Review of Systems  Constitutional: Negative for  chills and fever.  Respiratory: Negative for shortness of breath.   Cardiovascular: Negative for chest pain.  Gastrointestinal: Positive for abdominal pain and vomiting. Negative for constipation, diarrhea and nausea.  Genitourinary: Negative for dysuria, frequency, hematuria and urgency.  Musculoskeletal: Positive for back pain.  All other systems reviewed and are negative.   Physical Exam Updated Vital Signs BP 121/83   Pulse 84   Temp 98.7 F (37.1 C) (Oral)   Resp 18   Ht 5\' 2"  (1.575 m)   Wt 86.2 kg   SpO2 100%   BMI 34.76 kg/m   Physical Exam Vitals and nursing note reviewed.  Constitutional:      General: She is not in acute distress.    Appearance: She is well-developed.  HENT:     Head: Normocephalic and atraumatic.  Eyes:     General:        Right eye: No discharge.        Left eye: No discharge.     Conjunctiva/sclera: Conjunctivae normal.  Neck:     Vascular: No JVD.     Trachea: No tracheal deviation.  Cardiovascular:     Rate and Rhythm: Normal rate and regular rhythm.  Pulmonary:     Effort: Pulmonary effort is normal.     Breath sounds: Normal breath sounds.  Abdominal:     General: Abdomen is flat. Bowel sounds are normal. There is no distension.     Palpations: Abdomen is soft.     Tenderness: There is no abdominal tenderness. There is no right CVA tenderness, left CVA tenderness, guarding or rebound. Negative signs include Murphy's sign.  Genitourinary:    Comments: Examination performed in the presence of a chaperone.  No masses or lesions to the external genitalia.  Moderate amount of thick white discharge in the vaginal vault.  No cervical motion tenderness, mild left adnexal tenderness but no ovarian enlargement or palpable masses noted. Skin:    General: Skin is warm and dry.     Findings: No erythema.  Neurological:     Mental Status: She is alert.  Psychiatric:        Behavior: Behavior normal.     ED Results / Procedures /  Treatments   Labs (all labs ordered are listed, but only abnormal results are displayed) Labs Reviewed  WET PREP, GENITAL - Abnormal; Notable for the following components:      Result Value   Clue Cells Wet Prep HPF POC PRESENT (*)    WBC, Wet Prep HPF POC MANY (*)    All other components within normal limits  COMPREHENSIVE METABOLIC PANEL - Abnormal; Notable for the following components:   Potassium 3.3 (*)    Glucose, Bld 132 (*)    Alkaline Phosphatase 37 (*)    All other components within normal limits  CBC - Abnormal; Notable  for the following components:   MCH 25.6 (*)    All other components within normal limits  URINALYSIS, ROUTINE W REFLEX MICROSCOPIC - Abnormal; Notable for the following components:   APPearance HAZY (*)    Hgb urine dipstick LARGE (*)    RBC / HPF >50 (*)    Bacteria, UA FEW (*)    All other components within normal limits  URINE CULTURE  LIPASE, BLOOD  I-STAT BETA HCG BLOOD, ED ([facility], [facility], [facility] ONLY)  GC/CHLAMYDIA PROBE AMP ([place]) NOT AT [facility]    EKG None  Radiology US PELVIC COMPLETE W TRANSVAGINAL AND TORSION R/O  Result Date: 07/11/2019 CLINICAL DATA:  Initial evaluation for acute left lower quadrant pain. EXAM: TRANSABDOMINAL AND TRANSVAGINAL ULTRASOUND OF PELVIS DOPPLER ULTRASOUND OF OVARIES TECHNIQUE: Both transabdominal and transvaginal ultrasound examinations of the pelvis were performed. Transabdominal technique was performed for global imaging of the pelvis including uterus, ovaries, adnexal regions, and pelvic cul-de-sac. It was necessary to proceed with endovaginal exam following the transabdominal exam to visualize the uterus, endometrium, and ovaries. Color and duplex Doppler ultrasound was utilized to evaluate blood flow to the ovaries. COMPARISON:  None. FINDINGS: Uterus Measurements: 9.4 x 5.0 x 5.9 cm = volume: 145 mL. 2.8 x 2.3 x 2.2 cm exophytic fibroid extends from the anterior uterine fundus. Endometrium Thickness: 10.6 mm.  No  focal abnormality visualized. Right ovary Measurements: 3.2 x 1.9 x 2.0 cm = volume: 6.3 mL. 1.2 x 0.8 x 1.2 cm simple cyst, most consistent with a normal physiologic follicular cyst/dominant follicle. No other adnexal mass. Left ovary Measurements: 3.5 x 2.1 x 2.2 cm = volume: 8.3 mL. 1.5 x 1.3 x 2.2 cm cyst with peripherally increased vascularity, consistent with a degenerating corpus luteal cyst. Small internal daughter cyst noted. No other adnexal mass. Pulsed Doppler evaluation of both ovaries demonstrates normal low-resistance arterial and venous waveforms. Other findings No abnormal free fluid. IMPRESSION: 1. 2.2 cm degenerating left ovarian corpus luteal cyst, which could contribute to left lower quadrant pain. No evidence for ovarian torsion. 2. 2.8 cm exophytic fibroid extending from the uterine fundus. 3. No other acute abnormality identified. Electronically Signed   By: Rise MuBenjamin  McClintock M.D.   On: 07/11/2019 08:06    Procedures Procedures (including critical care time)  Medications Ordered in ED Medications  sodium chloride flush (NS) 0.9 % injection 3 mL (0 mLs Intravenous Hold 07/11/19 0710)  potassium chloride SA (KLOR-CON) CR tablet 40 mEq (40 mEq Oral Given 07/11/19 0859)    ED Course  I have reviewed the triage vital signs and the nursing notes.  Pertinent labs & imaging results that were available during my care of the patient were reviewed by me and considered in my medical decision making (see chart for details).    MDM Rules/Calculators/A&P                          Patient presenting for evaluation of now resolved left lower quadrant abdominal pain that was noticed a few hours prior upon awakening and going to the bathroom.  She is afebrile, vital signs are stable.  She is nontoxic in appearance.  No rebound or guarding noted on examination of the abdomen and her abdomen is soft and generally nontender.  No CVA tenderness on examination.  Lab work obtained in triage  reviewed and interpreted by myself shows no leukocytosis, no anemia, no renal insufficiency or metabolic derangements.  LFTs and lipase are within  normal limits.  She is mildly hypokalemic but this was replenished orally in the ED.  Pregnancy test is negative and I have low suspicion of ectopic pregnancy.  Her UA shows greater than 50 RBCs and few bacteria and few WBCs as well.  She noted her urine appeared darker but she attributed this to dehydration.  Given hematuria, concern for potential nephrolithiasis.  Given she is currently asymptomatic its possible that she was passing a kidney stone and her symptoms resolved as it entered the bladder.  She was given a strainer and urinated in the ED but was unable to capture a kidney stone.  Differential diagnoses at this time include ovarian torsion, TOA, diverticulitis, nephrolithiasis.   Pelvic examination is reassuring, only mild left adnexal tenderness noted.  No cervical motion tenderness.  Wet prep shows WBCs and clue cells so we will cover for BV with Flagyl.  Pelvic ultrasound obtained shows a 2.2 cm degenerating left corpus luteum cyst no evidence for torsion.  This could certainly be the cause of her left lower quadrant abdominal pain.  While in the ED the patient tells me that she is experiencing urinary urgency and frequency.  She could be developing the start of a hemorrhagic cystitis.  Multiple repeat abdominal examinations are reassuring with no significant tenderness, rebound or guarding.  I have a low suspicion of acute surgical abdominal pathology at this time.  We will start her on Keflex for UTI, Flagyl for BV.  Recommend close follow-up with PCP for reevaluation of symptoms and we discussed strict ED return precautions.  She will return in the event she develops any concerning symptoms such as fever, worsening pain, or persistent vomiting.  Patient verbalized understanding of and agreement with plan and patient is stable for discharge at this time.   Discussed with Dr. Adela Lank who agrees with assessment and plan at this time.   Final Clinical Impression(s) / ED Diagnoses Final diagnoses:  Left lower quadrant abdominal pain  BV (bacterial vaginosis)  UTI symptoms    Rx / DC Orders ED Discharge Orders         Ordered    metroNIDAZOLE (FLAGYL) 500 MG tablet  2 times daily     Discontinue  Reprint     07/11/19 0855    cephALEXin (KEFLEX) 500 MG capsule  4 times daily     Discontinue  Reprint     07/11/19 0855           Jeanie Sewer, PA-C 07/11/19 0915    Melene Plan, DO 07/11/19 1043

## 2019-07-12 LAB — URINE CULTURE

## 2019-07-13 LAB — GC/CHLAMYDIA PROBE AMP (~~LOC~~) NOT AT ARMC
Chlamydia: NEGATIVE
Comment: NEGATIVE
Comment: NORMAL
Neisseria Gonorrhea: NEGATIVE

## 2020-04-08 ENCOUNTER — Other Ambulatory Visit: Payer: Self-pay

## 2020-04-08 ENCOUNTER — Observation Stay (HOSPITAL_COMMUNITY)
Admission: EM | Admit: 2020-04-08 | Discharge: 2020-04-09 | Disposition: A | Discharge status: Home / Self Care | Payer: Self-pay | Attending: Emergency Medicine | Admitting: Emergency Medicine

## 2020-04-08 ENCOUNTER — Encounter (HOSPITAL_COMMUNITY): Payer: Self-pay

## 2020-04-08 ENCOUNTER — Emergency Department (HOSPITAL_COMMUNITY): Payer: Self-pay

## 2020-04-08 DIAGNOSIS — Z20822 Contact with and (suspected) exposure to covid-19: Secondary | ICD-10-CM | POA: Insufficient documentation

## 2020-04-08 DIAGNOSIS — J45909 Unspecified asthma, uncomplicated: Secondary | ICD-10-CM | POA: Insufficient documentation

## 2020-04-08 DIAGNOSIS — D649 Anemia, unspecified: Principal | ICD-10-CM | POA: Diagnosis present

## 2020-04-08 DIAGNOSIS — Z79899 Other long term (current) drug therapy: Secondary | ICD-10-CM | POA: Insufficient documentation

## 2020-04-08 HISTORY — DX: Dermatitis, unspecified: L30.9

## 2020-04-08 LAB — BASIC METABOLIC PANEL
Anion gap: 5 (ref 5–15)
BUN: 9 mg/dL (ref 6–20)
CO2: 27 mmol/L (ref 22–32)
Calcium: 9.6 mg/dL (ref 8.9–10.3)
Chloride: 103 mmol/L (ref 98–111)
Creatinine, Ser: 0.87 mg/dL (ref 0.44–1.00)
GFR, Estimated: >60 mL/min (ref >60)
Glucose, Bld: 105 mg/dL — ABNORMAL HIGH (ref 70–99)
Potassium: 3.8 mmol/L (ref 3.5–5.1)
Sodium: 135 mmol/L (ref 135–145)

## 2020-04-08 LAB — CBC WITH DIFFERENTIAL/PLATELET
Abs Immature Granulocytes: 0.08 10*3/uL — ABNORMAL HIGH (ref 0.00–0.07)
Basophils Absolute: 0 10*3/uL (ref 0.0–0.1)
Basophils Relative: 0 %
Eosinophils Absolute: 0.5 10*3/uL (ref 0.0–0.5)
Eosinophils Relative: 7 %
HCT: 23.3 % — ABNORMAL LOW (ref 36.0–46.0)
Hemoglobin: 7.3 g/dL — ABNORMAL LOW (ref 12.0–15.0)
Immature Granulocytes: 1 %
Lymphocytes Relative: 33 %
Lymphs Abs: 2.3 10*3/uL (ref 0.7–4.0)
MCH: 26.5 pg (ref 26.0–34.0)
MCHC: 31.3 g/dL (ref 30.0–36.0)
MCV: 84.7 fL (ref 80.0–100.0)
Monocytes Absolute: 0.7 10*3/uL (ref 0.1–1.0)
Monocytes Relative: 10 %
Neutro Abs: 3.5 10*3/uL (ref 1.7–7.7)
Neutrophils Relative %: 49 %
Platelets: 303 10*3/uL (ref 150–400)
RBC: 2.75 MIL/uL — ABNORMAL LOW (ref 3.87–5.11)
RDW: 17.1 % — ABNORMAL HIGH (ref 11.5–15.5)
WBC: 7.1 10*3/uL (ref 4.0–10.5)
nRBC: 0.7 % — ABNORMAL HIGH (ref 0.0–0.2)

## 2020-04-08 LAB — D-DIMER, QUANTITATIVE: D-Dimer, Quant: 3.18 ug/mL-FEU — ABNORMAL HIGH (ref 0.00–0.50)

## 2020-04-08 LAB — TSH: TSH: 3.217 u[IU]/mL (ref 0.350–4.500)

## 2020-04-08 LAB — BRAIN NATRIURETIC PEPTIDE: B Natriuretic Peptide: 34.2 pg/mL (ref 0.0–100.0)

## 2020-04-08 LAB — HCG, QUANTITATIVE, PREGNANCY: hCG, Beta Chain, Quant, S: 2 m[IU]/mL (ref <5)

## 2020-04-08 MED ORDER — SODIUM CHLORIDE 0.9 % IV BOLUS
1000.0000 mL | Freq: Once | INTRAVENOUS | Status: AC
  Administered 2020-04-09: 1000 mL via INTRAVENOUS

## 2020-04-08 NOTE — ED Provider Notes (Signed)
[facility] EMERGENCY DEPARTMENT Provider Note   CSN: [id number] Arrival date & time: 04/08/20  2026     History Chief Complaint  Patient presents with  . multiple complaints    Patricia Mckinney is a 35 y.o. female.  Patient with history of asthma.  States she is having shortness of breath, lightheadedness, dizziness, fatigue and rapid heartbeat for the past 5 days.  She underwent liposuction and fat transfer in [place] 6 days ago.  She states she went to this facility because of the cheaper cost.  She had blood work before the surgery with a hemoglobin of 12.  States her stools have been dark because she takes iron.  1 day after she had the surgery she will shortness of breath, dizziness, fatigue, rapid heartbeat and palpitations.  She denies any chest pain.  She denies any significant abdominal pain.  She denies any heavy vaginal bleeding.  States her stools are dark but denies any grossly bloody stools.  She is on Lovenox postoperatively.  Does not take any birth control.  Has not required a blood transfusion in the past.  The history is provided by the patient.       Past Medical History:  Diagnosis Date  . Allergy   . Asthma   . Eczema     There are no problems to display for this patient.   Past Surgical History:  Procedure Laterality Date  . liposurgery       OB History   No obstetric history on file.     Family History  Problem Relation Age of Onset  . Hypertension Maternal Grandmother   . Hyperlipidemia Maternal Grandmother   . Diabetes Maternal Grandmother     Social History   Tobacco Use  . Smoking status: Never Smoker  . Smokeless tobacco: Never Used  Substance Use Topics  . Alcohol use: No    Alcohol/week: 0.0 standard drinks  . Drug use: No    Home Medications Prior to Admission medications   Medication Sig Start Date End Date Taking? Authorizing Provider  acetaminophen (TYLENOL) 325 MG tablet Take 2 tablets (650 mg total)  by mouth every 6 (six) hours as needed for mild pain or moderate pain. Patient not taking: Reported on 07/11/2019 07/08/16   Everlene Farrier, PA-C  acetaminophen (TYLENOL) 500 MG tablet Take 1,000 mg by mouth every 6 (six) hours as needed for mild pain.    [provider]  albuterol (VENTOLIN HFA) 108 (90 Base) MCG/ACT inhaler Inhale 2 puffs into the lungs every 4 (four) hours as needed for wheezing or shortness of breath.    [provider]  cetirizine (ZYRTEC) 10 MG tablet Take 10 mg by mouth daily.    [provider]  magic mouthwash w/lidocaine SOLN Take 10 mLs by mouth every 2 (two) hours as needed for mouth pain. Patient not taking: Reported on 07/11/2019 10/05/14   Brewington, Tishira R, PA-C  methocarbamol (ROBAXIN) 500 MG tablet Take 1 tablet (500 mg total) by mouth every 6 (six) hours as needed for muscle spasms. Patient not taking: Reported on 07/11/2019 03/26/16   Liberty Handy, PA-C  metroNIDAZOLE (FLAGYL) 500 MG tablet Take 1 tablet (500 mg total) by mouth 2 (two) times daily. 07/11/19   Fawze, Mina A, PA-C  Multiple Vitamin (MULTIVITAMIN WITH MINERALS) TABS tablet Take 1 tablet by mouth daily.    [provider]  omeprazole (PRILOSEC) 20 MG capsule Take 1 capsule (20 mg total) by mouth daily.  07/08/16   Everlene Farrier, PA-C  ondansetron (ZOFRAN ODT) 4 MG disintegrating tablet Take 1 tablet (4 mg total) by mouth every 8 (eight) hours as needed for nausea or vomiting. Patient not taking: Reported on 07/11/2019 07/08/16   Everlene Farrier, PA-C    Allergies    Shellfish allergy  Review of Systems   Review of Systems  Constitutional: Positive for fatigue.  HENT: Negative for congestion, nosebleeds and postnasal drip.   Respiratory: Positive for chest tightness and shortness of breath.   Cardiovascular: Negative for chest pain and leg swelling.  Gastrointestinal: Positive for abdominal pain. Negative for nausea and vomiting.  Genitourinary: Negative for  dysuria.  Musculoskeletal: Positive for arthralgias and myalgias.  Skin: Negative for wound.  Neurological: Positive for dizziness, weakness and light-headedness.   all other systems are negative except as noted in the HPI and PMH.   Physical Exam Updated Vital Signs BP 108/71 (BP Location: Left Arm)   Pulse 96   Temp 98.4 F (36.9 C) (Oral)   Resp 18   Ht 5\' 2"  (1.575 m)   Wt 86.2 kg   SpO2 100%   BMI 34.76 kg/m   Physical Exam Vitals and nursing note reviewed.  Constitutional:      General: She is not in acute distress.    Appearance: She is well-developed.     Comments: No acute distress, speaking full sentence  HENT:     Head: Normocephalic and atraumatic.     Mouth/Throat:     Pharynx: No oropharyngeal exudate.  Eyes:     Conjunctiva/sclera: Conjunctivae normal.     Pupils: Pupils are equal, round, and reactive to light.  Neck:     Comments: No meningismus. Cardiovascular:     Rate and Rhythm: Regular rhythm. Tachycardia present.     Heart sounds: Normal heart sounds. No murmur heard.   Pulmonary:     Effort: Pulmonary effort is normal. No respiratory distress.     Breath sounds: Normal breath sounds.  Abdominal:     Palpations: Abdomen is soft.     Tenderness: There is abdominal tenderness. There is no guarding or rebound.     Comments: Mild diffuse tenderness across her abdomen.  Surgical sites appear clean.  There is mild ecchymosis involving her left lateral abdomen and left buttock and flank. No guarding or rebound  Genitourinary:    Comments: Chaperone present.  No gross blood. Musculoskeletal:        General: No tenderness. Normal range of motion.     Cervical back: Normal range of motion and neck supple.  Skin:    General: Skin is warm.  Neurological:     Mental Status: She is alert and oriented to person, place, and time.     Cranial Nerves: No cranial nerve deficit.     Motor: No abnormal muscle tone.     Coordination: Coordination normal.      Comments:  5/5 strength throughout. CN 2-12 intact.Equal grip strength.   Psychiatric:        Behavior: Behavior normal.     ED Results / Procedures / Treatments   Labs (all labs ordered are listed, but only abnormal results are displayed) Labs Reviewed  BASIC METABOLIC PANEL - Abnormal; Notable for the following components:      Result Value   Glucose, Bld 105 (*)    All other components within normal limits  CBC WITH DIFFERENTIAL/PLATELET - Abnormal; Notable for the following components:   RBC 2.75 (*)  Hemoglobin 7.3 (*)    HCT 23.3 (*)    RDW 17.1 (*)    nRBC 0.7 (*)    Abs Immature Granulocytes 0.08 (*)    All other components within normal limits  D-DIMER, QUANTITATIVE - Abnormal; Notable for the following components:   D-Dimer, Quant 3.18 (*)    All other components within normal limits  RETICULOCYTES - Abnormal; Notable for the following components:   Retic Ct Pct 5.8 (*)    RBC. 2.68 (*)    Immature Retic Fract 38.1 (*)    All other components within normal limits  RESP PANEL BY RT-PCR (FLU A&B, COVID) ARPGX2  BRAIN NATRIURETIC PEPTIDE  TSH  HCG, QUANTITATIVE, PREGNANCY  FOLATE  VITAMIN B12  IRON AND TIBC  FERRITIN  HIV ANTIBODY (ROUTINE TESTING W REFLEX)  CBC  POC OCCULT BLOOD, ED  TYPE AND SCREEN  ABO/RH  PREPARE RBC (CROSSMATCH)    EKG EKG Interpretation  Date/Time:  Saturday April 08 2020 20:34:59 EDT Ventricular Rate:  105 PR Interval:  144 QRS Duration: 80 QT Interval:  314 QTC Calculation: 415 R Axis:   71 Text Interpretation: Sinus tachycardia Otherwise normal ECG No previous ECGs available Confirmed by Glynn Octave [phone]) on 04/08/2020 11:45:36 PM   Radiology CT Angio Chest PE W and/or Wo Contrast  Result Date: 04/09/2020 CLINICAL DATA:  Acute abdominal pain after liposuction 5 days ago. Shortness of breath and lightheadedness. EXAM: CT ANGIOGRAPHY CHEST CT ABDOMEN AND PELVIS WITH CONTRAST TECHNIQUE: Multidetector CT imaging of the  chest was performed using the standard protocol during bolus administration of intravenous contrast. Multiplanar CT image reconstructions and MIPs were obtained to evaluate the vascular anatomy. Multidetector CT imaging of the abdomen and pelvis was performed using the standard protocol during bolus administration of intravenous contrast. CONTRAST:  OMNIPAQUE IOHEXOL 350 MG/ML SOLN COMPARISON:  None. FINDINGS: CTA CHEST FINDINGS Cardiovascular: Satisfactory opacification of the pulmonary arteries to the segmental level. No evidence of pulmonary embolism. Normal heart size. No pericardial effusion. Normal caliber thoracic aorta. No aortic dissection. Great vessel origins are patent. Mediastinum/Nodes: No enlarged mediastinal, hilar, or axillary lymph nodes. Thyroid gland, trachea, and esophagus demonstrate no significant findings. Lungs/Pleura: Lungs are clear. No pleural effusion or pneumothorax. Musculoskeletal: No chest wall abnormality. No acute or significant osseous findings. Review of the MIP images confirms the above findings. CT ABDOMEN and PELVIS FINDINGS Hepatobiliary: No focal liver abnormality is seen. No gallstones, gallbladder wall thickening, or biliary dilatation. Pancreas: Unremarkable. No pancreatic ductal dilatation or surrounding inflammatory changes. Spleen: Normal in size without focal abnormality. Adrenals/Urinary Tract: Adrenal glands are unremarkable. Kidneys are normal, without renal calculi, focal lesion, or hydronephrosis. Bladder is unremarkable. Stomach/Bowel: Stomach is within normal limits. Appendix appears normal. No evidence of bowel wall thickening, distention, or inflammatory changes. Vascular/Lymphatic: No significant vascular findings are present. No enlarged abdominal or pelvic lymph nodes. Reproductive: Uterus and ovaries are not enlarged. Involuting follicle in the right ovary. Small amount of free fluid in the pelvis is probably physiologic. Other: Diffuse edema and  scattered gas collections in the subcutaneous fat throughout the abdominal wall. This is most likely postoperative but could also represent edema or cellulitis. Correlate with physical examination. Probable focal loculation in the right flank region measuring 3.7 x 6.1 cm. This is likely postoperative seroma. Musculoskeletal: No acute or significant osseous findings. Review of the MIP images confirms the above findings. IMPRESSION: 1. No evidence of significant pulmonary embolus. 2. No evidence of active pulmonary disease. 3. No  acute process demonstrated in the abdomen or pelvis. 4. Diffuse edema and scattered gas collections in the subcutaneous fat throughout the abdominal wall. This is likely postoperative change but could also could represent edema or cellulitis. Correlate with physical examination. 5. Probable focal loculation in the right flank region measuring 3.7 x 6.1 cm. This is likely postoperative seroma. Electronically Signed   By: Burman Nieves M.D.   On: 04/09/2020 02:12   CT ABDOMEN PELVIS W CONTRAST  Result Date: 04/09/2020 CLINICAL DATA:  Acute abdominal pain after liposuction 5 days ago. Shortness of breath and lightheadedness. EXAM: CT ANGIOGRAPHY CHEST CT ABDOMEN AND PELVIS WITH CONTRAST TECHNIQUE: Multidetector CT imaging of the chest was performed using the standard protocol during bolus administration of intravenous contrast. Multiplanar CT image reconstructions and MIPs were obtained to evaluate the vascular anatomy. Multidetector CT imaging of the abdomen and pelvis was performed using the standard protocol during bolus administration of intravenous contrast. CONTRAST:  OMNIPAQUE IOHEXOL 350 MG/ML SOLN COMPARISON:  None. FINDINGS: CTA CHEST FINDINGS Cardiovascular: Satisfactory opacification of the pulmonary arteries to the segmental level. No evidence of pulmonary embolism. Normal heart size. No pericardial effusion. Normal caliber thoracic aorta. No aortic dissection. Great  vessel origins are patent. Mediastinum/Nodes: No enlarged mediastinal, hilar, or axillary lymph nodes. Thyroid gland, trachea, and esophagus demonstrate no significant findings. Lungs/Pleura: Lungs are clear. No pleural effusion or pneumothorax. Musculoskeletal: No chest wall abnormality. No acute or significant osseous findings. Review of the MIP images confirms the above findings. CT ABDOMEN and PELVIS FINDINGS Hepatobiliary: No focal liver abnormality is seen. No gallstones, gallbladder wall thickening, or biliary dilatation. Pancreas: Unremarkable. No pancreatic ductal dilatation or surrounding inflammatory changes. Spleen: Normal in size without focal abnormality. Adrenals/Urinary Tract: Adrenal glands are unremarkable. Kidneys are normal, without renal calculi, focal lesion, or hydronephrosis. Bladder is unremarkable. Stomach/Bowel: Stomach is within normal limits. Appendix appears normal. No evidence of bowel wall thickening, distention, or inflammatory changes. Vascular/Lymphatic: No significant vascular findings are present. No enlarged abdominal or pelvic lymph nodes. Reproductive: Uterus and ovaries are not enlarged. Involuting follicle in the right ovary. Small amount of free fluid in the pelvis is probably physiologic. Other: Diffuse edema and scattered gas collections in the subcutaneous fat throughout the abdominal wall. This is most likely postoperative but could also represent edema or cellulitis. Correlate with physical examination. Probable focal loculation in the right flank region measuring 3.7 x 6.1 cm. This is likely postoperative seroma. Musculoskeletal: No acute or significant osseous findings. Review of the MIP images confirms the above findings. IMPRESSION: 1. No evidence of significant pulmonary embolus. 2. No evidence of active pulmonary disease. 3. No acute process demonstrated in the abdomen or pelvis. 4. Diffuse edema and scattered gas collections in the subcutaneous fat throughout  the abdominal wall. This is likely postoperative change but could also could represent edema or cellulitis. Correlate with physical examination. 5. Probable focal loculation in the right flank region measuring 3.7 x 6.1 cm. This is likely postoperative seroma. Electronically Signed   By: Burman Nieves M.D.   On: 04/09/2020 02:12   DG Chest Port 1 View  Result Date: 04/08/2020 CLINICAL DATA:  Shortness of breath. Dizziness, fatigue, and rapid heart rate. Surgery on Monday. EXAM: PORTABLE CHEST 1 VIEW COMPARISON:  None. FINDINGS: The heart size and mediastinal contours are within normal limits. Both lungs are clear. The visualized skeletal structures are unremarkable. IMPRESSION: No active disease. Electronically Signed   By: Burman Nieves M.D.   On: 04/08/2020  21:21    Procedures .Critical Care Performed by: Glynn Octaveancour, Jovaughn Wojtaszek, MD Authorized by: Glynn Octaveancour, Teyla Skidgel, MD   Critical care provider statement:    Critical care time (minutes):  45   Critical care was necessary to treat or prevent imminent or life-threatening deterioration of the following conditions:  Shock (symptomatic anemia)   Critical care was time spent personally by me on the following activities:  Discussions with consultants, evaluation of patient's response to treatment, examination of patient, ordering and performing treatments and interventions, ordering and review of laboratory studies, ordering and review of radiographic studies, pulse oximetry, re-evaluation of patient's condition, obtaining history from patient or surrogate and review of old charts     Medications Ordered in ED Medications  sodium chloride 0.9 % bolus 1,000 mL (has no administration in time range)    ED Course  I have reviewed the triage vital signs and the nursing notes.  Pertinent labs & imaging results that were available during my care of the patient were reviewed by me and considered in my medical decision making (see chart for details).     MDM Rules/Calculators/A&P                         Patient with 5 days of palpitations, shortness of breath, dizziness, lightheadedness.  Status post liposuction to [place] 6 days ago.  EKG is sinus tachycardia.  Hemoglobin here is 7.3.  Patient reports it was greater than 12 prior to the surgery. She is agreeable to receive a blood transfusion. Hemoccult here is negative  Pulmonary embolism will be ruled out but suspect her symptoms are likely due to symptomatic anemia.  Imaging shows no pulmonary embolism.  Does show expected postoperative swelling along the abdominal wall and flank ALso has likely postoperative seroma in the right flank  Patient is agreeable to blood transfusion for symptomatic anemia.  She is confident her hemoglobin was 12 on March 8.  Hemoccult is negative.  She denies any significant vaginal bleeding.  Vitals remained stable.  Admission for blood transfusion discussed with Dr. Julian ReilGardner Final Clinical Impression(s) / ED Diagnoses Final diagnoses:  Symptomatic anemia    Rx / DC Orders ED Discharge Orders    None       Glynn Octaveancour, Jennavecia Schwier, MD 04/09/20 [phone]

## 2020-04-08 NOTE — ED Triage Notes (Signed)
Pt had lipo on Monday - now having shortness of breath, dizziness, fatigue, rapid heart rate. Well appearing surgical site. Denies any fever.

## 2020-04-08 NOTE — ED Provider Notes (Signed)
MSE was initiated and I personally evaluated the patient and placed orders (if any) at  8:44 PM on April 08, 2020.  The patient appears stable so that the remainder of the MSE may be completed by another provider.  Patient presents complaining of shortness of breath and lightheadedness.  She report having elective liposuction procedure 5 days ago.  Report minimal tenderness to her surgical site however she has noticed feeling short of breath, heart palpitation, lightheadedness, and overall not feeling well.  No prior history of PE or DVT.  She has been taking her Lovenox.  She denies any history of thyroid disease.  On exam patient is tachycardic however well-appearing.  Abdominal exam without any focal tenderness, normal appearing surgical site at her lower pannus.  Abdomen is soft.    Fayrene Helper, PA-C 04/08/20 2046    Koleen Distance, MD 04/09/20 [phone]

## 2020-04-09 ENCOUNTER — Emergency Department (HOSPITAL_COMMUNITY): Payer: Self-pay

## 2020-04-09 DIAGNOSIS — D649 Anemia, unspecified: Secondary | ICD-10-CM | POA: Diagnosis present

## 2020-04-09 LAB — IRON AND TIBC
Iron: 80 ug/dL (ref 28–170)
Saturation Ratios: 29 % (ref 10.4–31.8)
TIBC: 276 ug/dL (ref 250–450)
UIBC: 196 ug/dL

## 2020-04-09 LAB — HEMOGLOBIN AND HEMATOCRIT, BLOOD
HCT: 27.9 % — ABNORMAL LOW (ref 36.0–46.0)
Hemoglobin: 9.3 g/dL — ABNORMAL LOW (ref 12.0–15.0)

## 2020-04-09 LAB — POC OCCULT BLOOD, ED: Fecal Occult Bld: NEGATIVE

## 2020-04-09 LAB — FERRITIN: Ferritin: 186 ng/mL (ref 11–307)

## 2020-04-09 LAB — ABO/RH: ABO/RH(D): O POS

## 2020-04-09 LAB — HIV ANTIBODY (ROUTINE TESTING W REFLEX): HIV Screen 4th Generation wRfx: NONREACTIVE

## 2020-04-09 LAB — RESP PANEL BY RT-PCR (FLU A&B, COVID) ARPGX2
Influenza A by PCR: NEGATIVE
Influenza B by PCR: NEGATIVE
SARS Coronavirus 2 by RT PCR: NEGATIVE

## 2020-04-09 LAB — FOLATE: Folate: 14.8 ng/mL (ref >5.9)

## 2020-04-09 LAB — RETICULOCYTES
Immature Retic Fract: 38.1 % — ABNORMAL HIGH (ref 2.3–15.9)
RBC.: 2.68 MIL/uL — ABNORMAL LOW (ref 3.87–5.11)
Retic Count, Absolute: 154.4 10*3/uL (ref 19.0–186.0)
Retic Ct Pct: 5.8 % — ABNORMAL HIGH (ref 0.4–3.1)

## 2020-04-09 LAB — PREPARE RBC (CROSSMATCH)

## 2020-04-09 LAB — VITAMIN B12: Vitamin B-12: >7500 pg/mL — ABNORMAL HIGH (ref 180–914)

## 2020-04-09 MED ORDER — ACETAMINOPHEN 325 MG PO TABS
650.0000 mg | ORAL_TABLET | Freq: Once | ORAL | Status: AC
  Administered 2020-04-09: 650 mg via ORAL
  Filled 2020-04-09: qty 2

## 2020-04-09 MED ORDER — SULFAMETHOXAZOLE-TRIMETHOPRIM 800-160 MG PO TABS
1.0000 | ORAL_TABLET | Freq: Two times a day (BID) | ORAL | Status: DC
  Administered 2020-04-09: 1 via ORAL
  Filled 2020-04-09: qty 1

## 2020-04-09 MED ORDER — IOHEXOL 350 MG/ML SOLN
100.0000 mL | Freq: Once | INTRAVENOUS | Status: AC | PRN
  Administered 2020-04-09: 100 mL via INTRAVENOUS

## 2020-04-09 MED ORDER — ACETAMINOPHEN 325 MG PO TABS: 650.0000 mg | ORAL_TABLET | Freq: Four times a day (QID) | ORAL | Status: DC | PRN

## 2020-04-09 MED ORDER — MONTELUKAST SODIUM 10 MG PO TABS: 10.0000 mg | ORAL_TABLET | Freq: Every day | ORAL | Status: DC

## 2020-04-09 MED ORDER — FERROUS SULFATE 325 (65 FE) MG PO TABS
325.0000 mg | ORAL_TABLET | Freq: Every day | ORAL | Status: DC
  Administered 2020-04-09: 325 mg via ORAL
  Filled 2020-04-09: qty 1

## 2020-04-09 MED ORDER — ACETAMINOPHEN 650 MG RE SUPP: 650.0000 mg | Freq: Four times a day (QID) | RECTAL | Status: DC | PRN

## 2020-04-09 MED ORDER — ADULT MULTIVITAMIN W/MINERALS CH
1.0000 | ORAL_TABLET | Freq: Every day | ORAL | Status: DC
  Administered 2020-04-09: 1 via ORAL
  Filled 2020-04-09: qty 1

## 2020-04-09 MED ORDER — ALBUTEROL SULFATE HFA 108 (90 BASE) MCG/ACT IN AERS: 2.0000 | INHALATION_SPRAY | RESPIRATORY_TRACT | Status: DC | PRN

## 2020-04-09 MED ORDER — SODIUM CHLORIDE 0.9 % IV SOLN
10.0000 mL/h | Freq: Once | INTRAVENOUS | Status: AC
  Administered 2020-04-09: 10 mL/h via INTRAVENOUS

## 2020-04-09 MED ORDER — ONDANSETRON HCL 4 MG PO TABS: 4.0000 mg | ORAL_TABLET | Freq: Four times a day (QID) | ORAL | Status: DC | PRN

## 2020-04-09 MED ORDER — PANTOPRAZOLE SODIUM 40 MG PO TBEC
40.0000 mg | DELAYED_RELEASE_TABLET | Freq: Every day | ORAL | Status: DC
  Administered 2020-04-09: 40 mg via ORAL
  Filled 2020-04-09: qty 1

## 2020-04-09 MED ORDER — ONDANSETRON HCL 4 MG/2ML IJ SOLN: 4.0000 mg | Freq: Four times a day (QID) | INTRAMUSCULAR | Status: DC | PRN

## 2020-04-09 MED ORDER — FOLIC ACID 1 MG PO TABS
500.0000 ug | ORAL_TABLET | Freq: Every day | ORAL | Status: DC
  Administered 2020-04-09: 0.5 mg via ORAL
  Filled 2020-04-09: qty 1

## 2020-04-09 NOTE — Progress Notes (Signed)
Patient was discharged from [phone]. IV's d/c'd, skin intact with the exception of small, closed incision sites on lower abdomen and bilateral buttocks. Vital signs stable. AVS reviewed with patient and she verbalized understanding. Patient has all belongings.

## 2020-04-09 NOTE — ED Notes (Signed)
Patient back from CT.

## 2020-04-09 NOTE — ED Notes (Signed)
Patient to CT.

## 2020-04-09 NOTE — H&P (Signed)
History and Physical    Patricia Mckinney IOE:[id number] DOB: [birth year] DOA: 04/08/2020  PCP: Patient, No Pcp Per (Inactive)  Patient coming from: Home  I have personally briefly reviewed patient's old medical records in [facility]  Chief Complaint: Fatigue, SOB  HPI: Patricia Mckinney is a 35 y.o. female with medical history significant of elective liposuction surgery 5 days ago in [place].  Since then feeling SOB, palpitations, lightheaded.  Symptoms are persistent, severe.  Minimal TTP at surgical site.  Activity makes symptoms worse.  No fevers, chills.  Has been taking lovenox post op.   ED Course: HGB 7.3 today down from 12 pre-op according to patient  Hemoccult neg  2u PRBC transfusion ordered by EDP.  EDP wants obs admit.   Review of Systems: As per HPI, otherwise all review of systems negative.  Past Medical History:  Diagnosis Date  . Allergy   . Asthma   . Eczema     Past Surgical History:  Procedure Laterality Date  . liposurgery       reports that she has never smoked. She has never used smokeless tobacco. She reports that she does not drink alcohol and does not use drugs.  Allergies  Allergen Reactions  . Shellfish Allergy Anaphylaxis    Family History  Problem Relation Age of Onset  . Hypertension Maternal Grandmother   . Hyperlipidemia Maternal Grandmother   . Diabetes Maternal Grandmother      Prior to Admission medications   Medication Sig Start Date End Date Taking? Authorizing Provider  acetaminophen (TYLENOL) 325 MG tablet Take 2 tablets (650 mg total) by mouth every 6 (six) hours as needed for mild pain or moderate pain. Patient not taking: Reported on 07/11/2019 07/08/16   Everlene Farrier, PA-C  acetaminophen (TYLENOL) 500 MG tablet Take 1,000 mg by mouth every 6 (six) hours as needed for mild pain.    [provider]  albuterol (VENTOLIN HFA) 108 (90 Base) MCG/ACT inhaler Inhale 2 puffs into the lungs every 4 (four)  hours as needed for wheezing or shortness of breath.    [provider]  cetirizine (ZYRTEC) 10 MG tablet Take 10 mg by mouth daily.    [provider]  magic mouthwash w/lidocaine SOLN Take 10 mLs by mouth every 2 (two) hours as needed for mouth pain. Patient not taking: Reported on 07/11/2019 10/05/14   Brewington, Tishira R, PA-C  methocarbamol (ROBAXIN) 500 MG tablet Take 1 tablet (500 mg total) by mouth every 6 (six) hours as needed for muscle spasms. Patient not taking: Reported on 07/11/2019 03/26/16   Liberty Handy, PA-C  metroNIDAZOLE (FLAGYL) 500 MG tablet Take 1 tablet (500 mg total) by mouth 2 (two) times daily. 07/11/19   Fawze, Mina A, PA-C  Multiple Vitamin (MULTIVITAMIN WITH MINERALS) TABS tablet Take 1 tablet by mouth daily.    [provider]  omeprazole (PRILOSEC) 20 MG capsule Take 1 capsule (20 mg total) by mouth daily. 07/08/16   Everlene Farrier, PA-C  ondansetron (ZOFRAN ODT) 4 MG disintegrating tablet Take 1 tablet (4 mg total) by mouth every 8 (eight) hours as needed for nausea or vomiting. Patient not taking: Reported on 07/11/2019 07/08/16   Everlene Farrier, PA-C    Physical Exam: Vitals:   04/09/20 0151 04/09/20 0215 04/09/20 0227 04/09/20 0242  BP: (!) 91/43 110/60 110/60 117/66  Pulse: 100 (!) 105 96 93  Resp: 18 19 16 16   Temp:   98.4 F (36.9 C) 98.3 F (  36.8 C)  TempSrc:   Oral Oral  SpO2: 100% 98% 98%   Weight:      Height:        Constitutional: NAD, calm, comfortable Eyes: PERRL, lids and conjunctivae normal ENMT: Mucous membranes are moist. Posterior pharynx clear of any exudate or lesions.Normal dentition.  Neck: normal, supple, no masses, no thyromegaly Respiratory: clear to auscultation bilaterally, no wheezing, no crackles. Normal respiratory effort. No accessory muscle use.  Cardiovascular: Regular rate and rhythm, no murmurs / rubs / gallops. No extremity edema. 2+ pedal pulses. No carotid bruits.  Abdomen: no tenderness,  no masses palpated. No hepatosplenomegaly. Bowel sounds positive.  Musculoskeletal: no clubbing / cyanosis. No joint deformity upper and lower extremities. Good ROM, no contractures. Normal muscle tone.  Skin: no rashes, lesions, ulcers. No induration Neurologic: CN 2-12 grossly intact. Sensation intact, DTR normal. Strength 5/5 in all 4.  Psychiatric: Normal judgment and insight. Alert and oriented x 3. Normal mood.    Labs on Admission: I have personally reviewed following labs and imaging studies  CBC: Recent Labs  Lab 04/08/20 2050  WBC 7.1  NEUTROABS 3.5  HGB 7.3*  HCT 23.3*  MCV 84.7  PLT 303   Basic Metabolic Panel: Recent Labs  Lab 04/08/20 2050  NA 135  K 3.8  CL 103  CO2 27  GLUCOSE 105*  BUN 9  CREATININE 0.87  CALCIUM 9.6   GFR: Estimated Creatinine Clearance: 91 mL/min (by C-G formula based on SCr of 0.87 mg/dL). Liver Function Tests: No results for input(s): AST, ALT, ALKPHOS, BILITOT, PROT, ALBUMIN in the last 168 hours. No results for input(s): LIPASE, AMYLASE in the last 168 hours. No results for input(s): AMMONIA in the last 168 hours. Coagulation Profile: No results for input(s): INR, PROTIME in the last 168 hours. Cardiac Enzymes: No results for input(s): CKTOTAL, CKMB, CKMBINDEX, TROPONINI in the last 168 hours. BNP (last 3 results) No results for input(s): PROBNP in the last 8760 hours. HbA1C: No results for input(s): HGBA1C in the last 72 hours. CBG: No results for input(s): GLUCAP in the last 168 hours. Lipid Profile: No results for input(s): CHOL, HDL, LDLCALC, TRIG, CHOLHDL, LDLDIRECT in the last 72 hours. Thyroid Function Tests: Recent Labs    04/08/20 2051  TSH 3.217   Anemia Panel: Recent Labs    04/08/20 2358  FOLATE 14.8  RETICCTPCT 5.8*   Urine analysis:    Component Value Date/Time   COLORURINE YELLOW 07/11/2019 0347   APPEARANCEUR HAZY (A) 07/11/2019 0347   LABSPEC 1.018 07/11/2019 0347   PHURINE 5.0 07/11/2019  0347   GLUCOSEU NEGATIVE 07/11/2019 0347   HGBUR LARGE (A) 07/11/2019 0347   BILIRUBINUR NEGATIVE 07/11/2019 0347   KETONESUR NEGATIVE 07/11/2019 0347   PROTEINUR NEGATIVE 07/11/2019 0347   UROBILINOGEN 0.2 11/24/2006 2158   NITRITE NEGATIVE 07/11/2019 0347   LEUKOCYTESUR NEGATIVE 07/11/2019 0347    Radiological Exams on Admission: CT Angio Chest PE W and/or Wo Contrast  Result Date: 04/09/2020 CLINICAL DATA:  Acute abdominal pain after liposuction 5 days ago. Shortness of breath and lightheadedness. EXAM: CT ANGIOGRAPHY CHEST CT ABDOMEN AND PELVIS WITH CONTRAST TECHNIQUE: Multidetector CT imaging of the chest was performed using the standard protocol during bolus administration of intravenous contrast. Multiplanar CT image reconstructions and MIPs were obtained to evaluate the vascular anatomy. Multidetector CT imaging of the abdomen and pelvis was performed using the standard protocol during bolus administration of intravenous contrast. CONTRAST:  OMNIPAQUE IOHEXOL 350 MG/ML SOLN COMPARISON:  None. FINDINGS: CTA CHEST FINDINGS Cardiovascular: Satisfactory opacification of the pulmonary arteries to the segmental level. No evidence of pulmonary embolism. Normal heart size. No pericardial effusion. Normal caliber thoracic aorta. No aortic dissection. Great vessel origins are patent. Mediastinum/Nodes: No enlarged mediastinal, hilar, or axillary lymph nodes. Thyroid gland, trachea, and esophagus demonstrate no significant findings. Lungs/Pleura: Lungs are clear. No pleural effusion or pneumothorax. Musculoskeletal: No chest wall abnormality. No acute or significant osseous findings. Review of the MIP images confirms the above findings. CT ABDOMEN and PELVIS FINDINGS Hepatobiliary: No focal liver abnormality is seen. No gallstones, gallbladder wall thickening, or biliary dilatation. Pancreas: Unremarkable. No pancreatic ductal dilatation or surrounding inflammatory changes. Spleen: Normal in size  without focal abnormality. Adrenals/Urinary Tract: Adrenal glands are unremarkable. Kidneys are normal, without renal calculi, focal lesion, or hydronephrosis. Bladder is unremarkable. Stomach/Bowel: Stomach is within normal limits. Appendix appears normal. No evidence of bowel wall thickening, distention, or inflammatory changes. Vascular/Lymphatic: No significant vascular findings are present. No enlarged abdominal or pelvic lymph nodes. Reproductive: Uterus and ovaries are not enlarged. Involuting follicle in the right ovary. Small amount of free fluid in the pelvis is probably physiologic. Other: Diffuse edema and scattered gas collections in the subcutaneous fat throughout the abdominal wall. This is most likely postoperative but could also represent edema or cellulitis. Correlate with physical examination. Probable focal loculation in the right flank region measuring 3.7 x 6.1 cm. This is likely postoperative seroma. Musculoskeletal: No acute or significant osseous findings. Review of the MIP images confirms the above findings. IMPRESSION: 1. No evidence of significant pulmonary embolus. 2. No evidence of active pulmonary disease. 3. No acute process demonstrated in the abdomen or pelvis. 4. Diffuse edema and scattered gas collections in the subcutaneous fat throughout the abdominal wall. This is likely postoperative change but could also could represent edema or cellulitis. Correlate with physical examination. 5. Probable focal loculation in the right flank region measuring 3.7 x 6.1 cm. This is likely postoperative seroma. Electronically Signed   By: Burman NievesWilliam  Stevens M.D.   On: 04/09/2020 02:12   CT ABDOMEN PELVIS W CONTRAST  Result Date: 04/09/2020 CLINICAL DATA:  Acute abdominal pain after liposuction 5 days ago. Shortness of breath and lightheadedness. EXAM: CT ANGIOGRAPHY CHEST CT ABDOMEN AND PELVIS WITH CONTRAST TECHNIQUE: Multidetector CT imaging of the chest was performed using the standard  protocol during bolus administration of intravenous contrast. Multiplanar CT image reconstructions and MIPs were obtained to evaluate the vascular anatomy. Multidetector CT imaging of the abdomen and pelvis was performed using the standard protocol during bolus administration of intravenous contrast. CONTRAST:  100mL OMNIPAQUE IOHEXOL 350 MG/ML SOLN COMPARISON:  None. FINDINGS: CTA CHEST FINDINGS Cardiovascular: Satisfactory opacification of the pulmonary arteries to the segmental level. No evidence of pulmonary embolism. Normal heart size. No pericardial effusion. Normal caliber thoracic aorta. No aortic dissection. Great vessel origins are patent. Mediastinum/Nodes: No enlarged mediastinal, hilar, or axillary lymph nodes. Thyroid gland, trachea, and esophagus demonstrate no significant findings. Lungs/Pleura: Lungs are clear. No pleural effusion or pneumothorax. Musculoskeletal: No chest wall abnormality. No acute or significant osseous findings. Review of the MIP images confirms the above findings. CT ABDOMEN and PELVIS FINDINGS Hepatobiliary: No focal liver abnormality is seen. No gallstones, gallbladder wall thickening, or biliary dilatation. Pancreas: Unremarkable. No pancreatic ductal dilatation or surrounding inflammatory changes. Spleen: Normal in size without focal abnormality. Adrenals/Urinary Tract: Adrenal glands are unremarkable. Kidneys are normal, without renal calculi, focal lesion, or hydronephrosis. Bladder is unremarkable. Stomach/Bowel: Stomach is  within normal limits. Appendix appears normal. No evidence of bowel wall thickening, distention, or inflammatory changes. Vascular/Lymphatic: No significant vascular findings are present. No enlarged abdominal or pelvic lymph nodes. Reproductive: Uterus and ovaries are not enlarged. Involuting follicle in the right ovary. Small amount of free fluid in the pelvis is probably physiologic. Other: Diffuse edema and scattered gas collections in the  subcutaneous fat throughout the abdominal wall. This is most likely postoperative but could also represent edema or cellulitis. Correlate with physical examination. Probable focal loculation in the right flank region measuring 3.7 x 6.1 cm. This is likely postoperative seroma. Musculoskeletal: No acute or significant osseous findings. Review of the MIP images confirms the above findings. IMPRESSION: 1. No evidence of significant pulmonary embolus. 2. No evidence of active pulmonary disease. 3. No acute process demonstrated in the abdomen or pelvis. 4. Diffuse edema and scattered gas collections in the subcutaneous fat throughout the abdominal wall. This is likely postoperative change but could also could represent edema or cellulitis. Correlate with physical examination. 5. Probable focal loculation in the right flank region measuring 3.7 x 6.1 cm. This is likely postoperative seroma. Electronically Signed   By: Burman Nieves M.D.   On: 04/09/2020 02:12   DG Chest Port 1 View  Result Date: 04/08/2020 CLINICAL DATA:  Shortness of breath. Dizziness, fatigue, and rapid heart rate. Surgery on Monday. EXAM: PORTABLE CHEST 1 VIEW COMPARISON:  None. FINDINGS: The heart size and mediastinal contours are within normal limits. Both lungs are clear. The visualized skeletal structures are unremarkable. IMPRESSION: No active disease. Electronically Signed   By: Burman Nieves M.D.   On: 04/08/2020 21:21    EKG: Independently reviewed.  Assessment/Plan Principal Problem:   Symptomatic anemia    1. Symptomatic anemia 1. 2u PRBC transfusion ordered by EDP 2. Tele monitor 3. Repeat CBC in AM 4. No evidence of active bleed at this time 5. But will stop lovenox 6. Hemoccult neg  DVT prophylaxis: SCDs Code Status: Full Family Communication: no family in room Disposition Plan: Home after HGB stable Consults called: None Admission status: Place in 64     Alaynah Schutter M. DO [facility]  How  to contact the [facility] Attending or Consulting provider 7A - 7P or covering provider during after hours 7P -7A, for this patient?  1. Check the care team in [facility] and look for a) attending/consulting [facility] provider listed and b) the [facility] team listed 2. Log into [email or web address]  Amion Physician Scheduling and messaging for groups and whole hospitals  On call and physician scheduling software for group practices, residents, hospitalists and other medical providers for call, clinic, rotation and shift schedules. On[employer] is a hospital-wide system for scheduling doctors and paging doctors on call. EasyPlot is for scientific plotting and data analysis.  [email or web address]  and use [facility] universal password to access. If you do not have the password, please contact the hospital operator.  3. Locate the [facility] provider you are looking for under [facility] and page to a number that you can be directly reached. 4. If you still have difficulty reaching the provider, please page the [facility] (Director on Call) for the Hospitalists listed on amion for assistance.  04/09/2020, 2:50 AM

## 2020-04-09 NOTE — ED Notes (Signed)
ED Provider at bedside. 

## 2020-04-09 NOTE — ED Notes (Signed)
Informed consent obtained and at bedside.  

## 2020-04-09 NOTE — ED Notes (Signed)
Incision sites to bilateral buttocks and abdomen - no redness, drainage, or warmth present.

## 2020-04-09 NOTE — Progress Notes (Signed)
PATIENT DETAILS Name: Patricia Mckinney Age: 35 y.o. Sex: female Date of Birth: Nov 12, 1985 MRN: [id number]. Admitting Physician: Hillary Bow, DO [id number], No Pcp Per (Inactive)  Admit Date: 04/08/2020 Discharge date: 04/09/2020  Recommendations for Outpatient Follow-up:  1. Follow up with PCP in 1-2 weeks 2. Please obtain CMP/CBC in one week   Admitted From:  Home   Disposition: Home   Home Health: No  Equipment/Devices: None  Discharge Condition: Stable  CODE STATUS: FULL CODE  Diet recommendation:  Diet Order            Diet regular Room service appropriate? Yes; Fluid consistency: Thin  Diet effective now           Diet general                  Brief Summary: See H&P, Labs, Consult and Test reports for all details in brief, patient is a 35 year old female with history of asthma-who had elective liposuction surgery on 3/28 [place]-starting on 3/30-she started having mild exertional dyspnea and palpitation but continued to worsen-she subsequently presented to the ED on 4/3-she was found to have hemoglobin of 7.3.  She was subsequently admitted to the hospitalist service for further evaluation and treatment.  Brief Hospital Course: Acute perioperative blood loss anemia: Per patient history-preoperatively her hemoglobin was 12-her symptoms related to anemia began on the second postoperative day per patient.  She was admitted-and transfused 2 units of PRBC-hemoglobin now stable-and she was symptomatically better after PRBC transfusion with hardly any shortness of breath.  She will require repeat CBC at PCPs office in 1 week.  She has no history of menorrhagia-FOBT was negative.  There is no other source for blood loss-no any other explanation for anemia.  CTA chest/abdomen was negative for PE-showed diffuse edema and scattered gas collection in the subcutaneous fat-likely a sequelae of her recent surgery.  She is to follow with her plastic surgeon as previously  scheduled.  She apparently was put on prophylactic Lovenox for 7 days-however she does not intend to take it anymore-there is no evidence of active blood loss.  She is to resume her postoperative medications including Bactrim as previously prescribed.    Asthma: Stable  Obesity: Estimated body mass index is 34.6 kg/m as calculated from the following:   Height as of this encounter:  (1.6 m).   Weight as of this encounter: 88.6 kg.    Discharge Diagnoses:  Principal Problem:   Symptomatic anemia   Discharge Instructions:  Activity:  As tolerated   Discharge Instructions    Call MD for:  difficulty breathing, headache or visual disturbances   Complete by: As directed    Call MD for:  redness, tenderness, or signs of infection (pain, swelling, redness, odor or green/yellow discharge around incision site)   Complete by: As directed    Diet general   Complete by: As directed    Discharge instructions   Complete by: As directed    Follow with Primary MD repeat CBC in 1 week  Follow with your plastic surgeon as previously instructed.  Please get a complete blood count and chemistry panel checked by your Primary MD at your next visit, and again as instructed by your Primary MD.  Get Medicines reviewed and adjusted: Please take all your medications with you for your next visit with your Primary MD  Laboratory/radiological data: Please request your Primary MD to go over all hospital tests and procedure/radiological results at the follow up,  please ask your Primary MD to get all Hospital records sent to his/her office.  In some cases, they will be blood work, cultures and biopsy results pending at the time of your discharge. Please request that your primary care M.D. follows up on these results.  Also Note the following: If you experience worsening of your admission symptoms, develop shortness of breath, life threatening emergency, suicidal or homicidal thoughts you must seek  medical attention immediately by calling 911 or calling your MD immediately  if symptoms less severe.  You must read complete instructions/literature along with all the possible adverse reactions/side effects for all the Medicines you take and that have been prescribed to you. Take any new Medicines after you have completely understood and accpet all the possible adverse reactions/side effects.   Do not drive when taking Pain medications or sleeping medications (Benzodaizepines)  Do not take more than prescribed Pain, Sleep and Anxiety Medications. It is not advisable to combine anxiety,sleep and pain medications without talking with your primary care practitioner  Special Instructions: If you have smoked or chewed Tobacco  in the last 2 yrs please stop smoking, stop any regular Alcohol  and or any Recreational drug use.  Wear Seat belts while driving.  Please note: You were cared for by a hospitalist during your hospital stay. Once you are discharged, your primary care physician will handle any further medical issues. Please note that NO REFILLS for any discharge medications will be authorized once you are discharged, as it is imperative that you return to your primary care physician (or establish a relationship with a primary care physician if you do not have one) for your post hospital discharge needs so that they can reassess your need for medications and monitor your lab values.   Increase activity slowly   Complete by: As directed      Allergies as of 04/09/2020      Reactions   Shellfish Allergy Anaphylaxis      Medication List    TAKE these medications   acetaminophen 325 MG tablet Commonly known as: TYLENOL Take 2 tablets (650 mg total) by mouth every 6 (six) hours as needed for mild pain or moderate pain.   albuterol 108 (90 Base) MCG/ACT inhaler Commonly known as: VENTOLIN HFA Inhale 2 puffs into the lungs every 4 (four) hours as needed for wheezing or shortness of breath.    enoxaparin 40 MG/0.4ML injection Commonly known as: LOVENOX Inject 40 mg into the skin See admin instructions. Qd x 7 days   ferrous sulfate 325 (65 FE) MG tablet Take 325 mg by mouth daily with breakfast.   fluconazole 150 MG tablet Commonly known as: DIFLUCAN Take 150 mg by mouth once.   folic acid 400 MCG tablet Commonly known as: FOLVITE Take 400 mcg by mouth daily.   montelukast 10 MG tablet Commonly known as: SINGULAIR Take 10 mg by mouth at bedtime.   multivitamin with minerals Tabs tablet Take 1 tablet by mouth daily.   omeprazole 20 MG capsule Commonly known as: PRILOSEC Take 1 capsule (20 mg total) by mouth daily. What changed:   when to take this  reasons to take this   ondansetron 4 MG disintegrating tablet Commonly known as: Zofran ODT Take 1 tablet (4 mg total) by mouth every 8 (eight) hours as needed for nausea or vomiting.   oxyCODONE-acetaminophen 5-325 MG tablet Commonly known as: PERCOCET/ROXICET Take 1 tablet by mouth every 4 (four) hours as needed for moderate pain or severe  pain.   sulfamethoxazole-trimethoprim 800-160 MG tablet Commonly known as: BACTRIM DS Take 1 tablet by mouth See admin instructions. Bid x 7 days       Follow-up Information    Primary care MD. Schedule an appointment as soon as possible for a visit in 1 week(s).              Allergies  Allergen Reactions  . Shellfish Allergy Anaphylaxis     Other Procedures/Studies: CT Angio Chest PE W and/or Wo Contrast  Result Date: 04/09/2020 CLINICAL DATA:  Acute abdominal pain after liposuction 5 days ago. Shortness of breath and lightheadedness. EXAM: CT ANGIOGRAPHY CHEST CT ABDOMEN AND PELVIS WITH CONTRAST TECHNIQUE: Multidetector CT imaging of the chest was performed using the standard protocol during bolus administration of intravenous contrast. Multiplanar CT image reconstructions and MIPs were obtained to evaluate the vascular anatomy. Multidetector CT imaging of the  abdomen and pelvis was performed using the standard protocol during bolus administration of intravenous contrast. CONTRAST:  OMNIPAQUE IOHEXOL 350 MG/ML SOLN COMPARISON:  None. FINDINGS: CTA CHEST FINDINGS Cardiovascular: Satisfactory opacification of the pulmonary arteries to the segmental level. No evidence of pulmonary embolism. Normal heart size. No pericardial effusion. Normal caliber thoracic aorta. No aortic dissection. Great vessel origins are patent. Mediastinum/Nodes: No enlarged mediastinal, hilar, or axillary lymph nodes. Thyroid gland, trachea, and esophagus demonstrate no significant findings. Lungs/Pleura: Lungs are clear. No pleural effusion or pneumothorax. Musculoskeletal: No chest wall abnormality. No acute or significant osseous findings. Review of the MIP images confirms the above findings. CT ABDOMEN and PELVIS FINDINGS Hepatobiliary: No focal liver abnormality is seen. No gallstones, gallbladder wall thickening, or biliary dilatation. Pancreas: Unremarkable. No pancreatic ductal dilatation or surrounding inflammatory changes. Spleen: Normal in size without focal abnormality. Adrenals/Urinary Tract: Adrenal glands are unremarkable. Kidneys are normal, without renal calculi, focal lesion, or hydronephrosis. Bladder is unremarkable. Stomach/Bowel: Stomach is within normal limits. Appendix appears normal. No evidence of bowel wall thickening, distention, or inflammatory changes. Vascular/Lymphatic: No significant vascular findings are present. No enlarged abdominal or pelvic lymph nodes. Reproductive: Uterus and ovaries are not enlarged. Involuting follicle in the right ovary. Small amount of free fluid in the pelvis is probably physiologic. Other: Diffuse edema and scattered gas collections in the subcutaneous fat throughout the abdominal wall. This is most likely postoperative but could also represent edema or cellulitis. Correlate with physical examination. Probable focal loculation in  the right flank region measuring 3.7 x 6.1 cm. This is likely postoperative seroma. Musculoskeletal: No acute or significant osseous findings. Review of the MIP images confirms the above findings. IMPRESSION: 1. No evidence of significant pulmonary embolus. 2. No evidence of active pulmonary disease. 3. No acute process demonstrated in the abdomen or pelvis. 4. Diffuse edema and scattered gas collections in the subcutaneous fat throughout the abdominal wall. This is likely postoperative change but could also could represent edema or cellulitis. Correlate with physical examination. 5. Probable focal loculation in the right flank region measuring 3.7 x 6.1 cm. This is likely postoperative seroma. Electronically Signed   By: Burman Nieves M.D.   On: 04/09/2020 02:12   CT ABDOMEN PELVIS W CONTRAST  Result Date: 04/09/2020 CLINICAL DATA:  Acute abdominal pain after liposuction 5 days ago. Shortness of breath and lightheadedness. EXAM: CT ANGIOGRAPHY CHEST CT ABDOMEN AND PELVIS WITH CONTRAST TECHNIQUE: Multidetector CT imaging of the chest was performed using the standard protocol during bolus administration of intravenous contrast. Multiplanar CT image reconstructions and MIPs were obtained to  evaluate the vascular anatomy. Multidetector CT imaging of the abdomen and pelvis was performed using the standard protocol during bolus administration of intravenous contrast. CONTRAST:  OMNIPAQUE IOHEXOL 350 MG/ML SOLN COMPARISON:  None. FINDINGS: CTA CHEST FINDINGS Cardiovascular: Satisfactory opacification of the pulmonary arteries to the segmental level. No evidence of pulmonary embolism. Normal heart size. No pericardial effusion. Normal caliber thoracic aorta. No aortic dissection. Great vessel origins are patent. Mediastinum/Nodes: No enlarged mediastinal, hilar, or axillary lymph nodes. Thyroid gland, trachea, and esophagus demonstrate no significant findings. Lungs/Pleura: Lungs are clear. No pleural effusion  or pneumothorax. Musculoskeletal: No chest wall abnormality. No acute or significant osseous findings. Review of the MIP images confirms the above findings. CT ABDOMEN and PELVIS FINDINGS Hepatobiliary: No focal liver abnormality is seen. No gallstones, gallbladder wall thickening, or biliary dilatation. Pancreas: Unremarkable. No pancreatic ductal dilatation or surrounding inflammatory changes. Spleen: Normal in size without focal abnormality. Adrenals/Urinary Tract: Adrenal glands are unremarkable. Kidneys are normal, without renal calculi, focal lesion, or hydronephrosis. Bladder is unremarkable. Stomach/Bowel: Stomach is within normal limits. Appendix appears normal. No evidence of bowel wall thickening, distention, or inflammatory changes. Vascular/Lymphatic: No significant vascular findings are present. No enlarged abdominal or pelvic lymph nodes. Reproductive: Uterus and ovaries are not enlarged. Involuting follicle in the right ovary. Small amount of free fluid in the pelvis is probably physiologic. Other: Diffuse edema and scattered gas collections in the subcutaneous fat throughout the abdominal wall. This is most likely postoperative but could also represent edema or cellulitis. Correlate with physical examination. Probable focal loculation in the right flank region measuring 3.7 x 6.1 cm. This is likely postoperative seroma. Musculoskeletal: No acute or significant osseous findings. Review of the MIP images confirms the above findings. IMPRESSION: 1. No evidence of significant pulmonary embolus. 2. No evidence of active pulmonary disease. 3. No acute process demonstrated in the abdomen or pelvis. 4. Diffuse edema and scattered gas collections in the subcutaneous fat throughout the abdominal wall. This is likely postoperative change but could also could represent edema or cellulitis. Correlate with physical examination. 5. Probable focal loculation in the right flank region measuring 3.7 x 6.1 cm. This is  likely postoperative seroma. Electronically Signed   By: Burman Nieves M.D.   On: 04/09/2020 02:12   DG Chest Port 1 View  Result Date: 04/08/2020 CLINICAL DATA:  Shortness of breath. Dizziness, fatigue, and rapid heart rate. Surgery on Monday. EXAM: PORTABLE CHEST 1 VIEW COMPARISON:  None. FINDINGS: The heart size and mediastinal contours are within normal limits. Both lungs are clear. The visualized skeletal structures are unremarkable. IMPRESSION: No active disease. Electronically Signed   By: Burman Nieves M.D.   On: 04/08/2020 21:21     TODAY-DAY OF DISCHARGE:  Subjective:   Patricia Mckinney today has no headache,no chest abdominal pain,no new weakness tingling or numbness, feels much better wants to go home today.   Objective:   Blood pressure (!) 98/59, pulse 90, temperature 98.4 F (36.9 C), temperature source Oral, resp. rate 16, height  (1.6 m), weight 88.6 kg, SpO2 99 %.  Intake/Output Summary (Last 24 hours) at 04/09/2020 1302 Last data filed at 04/09/2020 1010 Gross per 24 hour  Intake 619.17 ml  Output --  Net 619.17 ml   Filed Weights   04/08/20 2045 04/09/20 0605  Weight: 86.2 kg 88.6 kg    Exam: Awake Alert, Oriented *3, No new F.N deficits, Normal affect [place].AT,PERRAL Supple Neck,No JVD, No cervical lymphadenopathy appriciated.  Symmetrical Chest  wall movement, Good air movement bilaterally, CTAB RRR,No Gallops,Rubs or new Murmurs, No Parasternal Heave +ve B.Sounds, Abd Soft, Non tender, No organomegaly appriciated, No rebound -guarding or rigidity. No Cyanosis, Clubbing or edema, No new Rash or bruise   PERTINENT RADIOLOGIC STUDIES: CT Angio Chest PE W and/or Wo Contrast  Result Date: 04/09/2020 CLINICAL DATA:  Acute abdominal pain after liposuction 5 days ago. Shortness of breath and lightheadedness. EXAM: CT ANGIOGRAPHY CHEST CT ABDOMEN AND PELVIS WITH CONTRAST TECHNIQUE: Multidetector CT imaging of the chest was performed using the standard  protocol during bolus administration of intravenous contrast. Multiplanar CT image reconstructions and MIPs were obtained to evaluate the vascular anatomy. Multidetector CT imaging of the abdomen and pelvis was performed using the standard protocol during bolus administration of intravenous contrast. CONTRAST:  OMNIPAQUE IOHEXOL 350 MG/ML SOLN COMPARISON:  None. FINDINGS: CTA CHEST FINDINGS Cardiovascular: Satisfactory opacification of the pulmonary arteries to the segmental level. No evidence of pulmonary embolism. Normal heart size. No pericardial effusion. Normal caliber thoracic aorta. No aortic dissection. Great vessel origins are patent. Mediastinum/Nodes: No enlarged mediastinal, hilar, or axillary lymph nodes. Thyroid gland, trachea, and esophagus demonstrate no significant findings. Lungs/Pleura: Lungs are clear. No pleural effusion or pneumothorax. Musculoskeletal: No chest wall abnormality. No acute or significant osseous findings. Review of the MIP images confirms the above findings. CT ABDOMEN and PELVIS FINDINGS Hepatobiliary: No focal liver abnormality is seen. No gallstones, gallbladder wall thickening, or biliary dilatation. Pancreas: Unremarkable. No pancreatic ductal dilatation or surrounding inflammatory changes. Spleen: Normal in size without focal abnormality. Adrenals/Urinary Tract: Adrenal glands are unremarkable. Kidneys are normal, without renal calculi, focal lesion, or hydronephrosis. Bladder is unremarkable. Stomach/Bowel: Stomach is within normal limits. Appendix appears normal. No evidence of bowel wall thickening, distention, or inflammatory changes. Vascular/Lymphatic: No significant vascular findings are present. No enlarged abdominal or pelvic lymph nodes. Reproductive: Uterus and ovaries are not enlarged. Involuting follicle in the right ovary. Small amount of free fluid in the pelvis is probably physiologic. Other: Diffuse edema and scattered gas collections in the  subcutaneous fat throughout the abdominal wall. This is most likely postoperative but could also represent edema or cellulitis. Correlate with physical examination. Probable focal loculation in the right flank region measuring 3.7 x 6.1 cm. This is likely postoperative seroma. Musculoskeletal: No acute or significant osseous findings. Review of the MIP images confirms the above findings. IMPRESSION: 1. No evidence of significant pulmonary embolus. 2. No evidence of active pulmonary disease. 3. No acute process demonstrated in the abdomen or pelvis. 4. Diffuse edema and scattered gas collections in the subcutaneous fat throughout the abdominal wall. This is likely postoperative change but could also could represent edema or cellulitis. Correlate with physical examination. 5. Probable focal loculation in the right flank region measuring 3.7 x 6.1 cm. This is likely postoperative seroma. Electronically Signed   By: Burman Nieves M.D.   On: 04/09/2020 02:12   CT ABDOMEN PELVIS W CONTRAST  Result Date: 04/09/2020 CLINICAL DATA:  Acute abdominal pain after liposuction 5 days ago. Shortness of breath and lightheadedness. EXAM: CT ANGIOGRAPHY CHEST CT ABDOMEN AND PELVIS WITH CONTRAST TECHNIQUE: Multidetector CT imaging of the chest was performed using the standard protocol during bolus administration of intravenous contrast. Multiplanar CT image reconstructions and MIPs were obtained to evaluate the vascular anatomy. Multidetector CT imaging of the abdomen and pelvis was performed using the standard protocol during bolus administration of intravenous contrast. CONTRAST:  OMNIPAQUE IOHEXOL 350 MG/ML SOLN COMPARISON:  None.  FINDINGS: CTA CHEST FINDINGS Cardiovascular: Satisfactory opacification of the pulmonary arteries to the segmental level. No evidence of pulmonary embolism. Normal heart size. No pericardial effusion. Normal caliber thoracic aorta. No aortic dissection. Great vessel origins are patent.  Mediastinum/Nodes: No enlarged mediastinal, hilar, or axillary lymph nodes. Thyroid gland, trachea, and esophagus demonstrate no significant findings. Lungs/Pleura: Lungs are clear. No pleural effusion or pneumothorax. Musculoskeletal: No chest wall abnormality. No acute or significant osseous findings. Review of the MIP images confirms the above findings. CT ABDOMEN and PELVIS FINDINGS Hepatobiliary: No focal liver abnormality is seen. No gallstones, gallbladder wall thickening, or biliary dilatation. Pancreas: Unremarkable. No pancreatic ductal dilatation or surrounding inflammatory changes. Spleen: Normal in size without focal abnormality. Adrenals/Urinary Tract: Adrenal glands are unremarkable. Kidneys are normal, without renal calculi, focal lesion, or hydronephrosis. Bladder is unremarkable. Stomach/Bowel: Stomach is within normal limits. Appendix appears normal. No evidence of bowel wall thickening, distention, or inflammatory changes. Vascular/Lymphatic: No significant vascular findings are present. No enlarged abdominal or pelvic lymph nodes. Reproductive: Uterus and ovaries are not enlarged. Involuting follicle in the right ovary. Small amount of free fluid in the pelvis is probably physiologic. Other: Diffuse edema and scattered gas collections in the subcutaneous fat throughout the abdominal wall. This is most likely postoperative but could also represent edema or cellulitis. Correlate with physical examination. Probable focal loculation in the right flank region measuring 3.7 x 6.1 cm. This is likely postoperative seroma. Musculoskeletal: No acute or significant osseous findings. Review of the MIP images confirms the above findings. IMPRESSION: 1. No evidence of significant pulmonary embolus. 2. No evidence of active pulmonary disease. 3. No acute process demonstrated in the abdomen or pelvis. 4. Diffuse edema and scattered gas collections in the subcutaneous fat throughout the abdominal wall. This is  likely postoperative change but could also could represent edema or cellulitis. Correlate with physical examination. 5. Probable focal loculation in the right flank region measuring 3.7 x 6.1 cm. This is likely postoperative seroma. Electronically Signed   By: Burman Nieves M.D.   On: 04/09/2020 02:12   DG Chest Port 1 View  Result Date: 04/08/2020 CLINICAL DATA:  Shortness of breath. Dizziness, fatigue, and rapid heart rate. Surgery on Monday. EXAM: PORTABLE CHEST 1 VIEW COMPARISON:  None. FINDINGS: The heart size and mediastinal contours are within normal limits. Both lungs are clear. The visualized skeletal structures are unremarkable. IMPRESSION: No active disease. Electronically Signed   By: Burman Nieves M.D.   On: 04/08/2020 21:21     PERTINENT LAB RESULTS: CBC: Recent Labs    04/08/20 2050 04/09/20 1204  WBC 7.1  --   HGB 7.3* 9.3*  HCT 23.3* 27.9*  PLT 303  --    CMET CMP     Component Value Date/Time   NA 135 04/08/2020 2050   K 3.8 04/08/2020 2050   CL 103 04/08/2020 2050   CO2 27 04/08/2020 2050   GLUCOSE 105 (H) 04/08/2020 2050   BUN 9 04/08/2020 2050   CREATININE 0.87 04/08/2020 2050   CALCIUM 9.6 04/08/2020 2050   PROT 6.7 07/11/2019 0351   ALBUMIN 3.6 07/11/2019 0351   AST 16 07/11/2019 0351   ALT 14 07/11/2019 0351   ALKPHOS 37 (L) 07/11/2019 0351   BILITOT 0.6 07/11/2019 0351   GFRNONAA >60 04/08/2020 2050   GFRAA >60 07/11/2019 0351    GFR Estimated Creatinine Clearance: 94.4 mL/min (by C-G formula based on SCr of 0.87 mg/dL). No results for input(s): LIPASE, AMYLASE in the  last 72 hours. No results for input(s): CKTOTAL, CKMB, CKMBINDEX, TROPONINI in the last 72 hours. Invalid input(s): POCBNP Recent Labs    04/08/20 2050  DDIMER 3.18*   No results for input(s): HGBA1C in the last 72 hours. No results for input(s): CHOL, HDL, LDLCALC, TRIG, CHOLHDL, LDLDIRECT in the last 72 hours. Recent Labs    04/08/20 2051  TSH 3.217   Recent Labs     04/08/20 2358  VITAMINB12 >7,500*  FOLATE 14.8  FERRITIN 186  TIBC 276  IRON 80  RETICCTPCT 5.8*   Coags: No results for input(s): INR in the last 72 hours.  Invalid input(s): PT Microbiology: Recent Results (from the past 240 hour(s))  Resp Panel by RT-PCR (Flu A&B, Covid) Nasopharyngeal Swab     Status: None   Collection Time: 04/09/20  2:47 AM   Specimen: Nasopharyngeal Swab; Nasopharyngeal(NP) swabs in vial transport medium  Result Value Ref Range Status   SARS Coronavirus 2 by RT PCR NEGATIVE NEGATIVE Final    Comment: (NOTE) SARS-CoV-2 target nucleic acids are NOT DETECTED.  The SARS-CoV-2 RNA is generally detectable in upper respiratory specimens during the acute phase of infection. The lowest concentration of SARS-CoV-2 viral copies this assay can detect is 138 copies/mL. A negative result does not preclude SARS-Cov-2 infection and should not be used as the sole basis for treatment or other patient management decisions. A negative result may occur with  improper specimen collection/handling, submission of specimen other than nasopharyngeal swab, presence of viral mutation(s) within the areas targeted by this assay, and inadequate number of viral copies(<138 copies/mL). A negative result must be combined with clinical observations, patient history, and epidemiological information. The expected result is Negative.  Fact Sheet for Patients:  BloggerCourse.com  Fact Sheet for Healthcare Providers:  SeriousBroker.it  This test is no t yet approved or cleared by the [place] FDA and  has been authorized for detection and/or diagnosis of SARS-CoV-2 by FDA under an Emergency Use Authorization (EUA). This EUA will remain  in effect (meaning this test can be used) for the duration of the COVID-19 declaration under Section 564(b)(1) of the Act, 21 U.S.C.section 360bbb-3(b)(1), unless the authorization is terminated   or revoked sooner.       Influenza A by PCR NEGATIVE NEGATIVE Final   Influenza B by PCR NEGATIVE NEGATIVE Final    Comment: (NOTE) The Xpert Xpress SARS-CoV-2/FLU/RSV plus assay is intended as an aid in the diagnosis of influenza from Nasopharyngeal swab specimens and should not be used as a sole basis for treatment. Nasal washings and aspirates are unacceptable for Xpert Xpress SARS-CoV-2/FLU/RSV testing.  Fact Sheet for Patients: BloggerCourse.com  Fact Sheet for Healthcare Providers: SeriousBroker.it  This test is not yet approved or cleared by the [place] FDA and has been authorized for detection and/or diagnosis of SARS-CoV-2 by FDA under an Emergency Use Authorization (EUA). This EUA will remain in effect (meaning this test can be used) for the duration of the COVID-19 declaration under Section 564(b)(1) of the Act, 21 U.S.C. section 360bbb-3(b)(1), unless the authorization is terminated or revoked.  Performed at [facility], [address] [place], [place] [address]     FURTHER DISCHARGE INSTRUCTIONS:  Get Medicines reviewed and adjusted: Please take all your medications with you for your next visit with your Primary MD  Laboratory/radiological data: Please request your Primary MD to go over all hospital tests and procedure/radiological results at the follow up, please ask your Primary MD to get  all Hospital records sent to his/her office.  In some cases, they will be blood work, cultures and biopsy results pending at the time of your discharge. Please request that your primary care M.D. goes through all the records of your hospital data and follows up on these results.  Also Note the following: If you experience worsening of your admission symptoms, develop shortness of breath, life threatening emergency, suicidal or homicidal thoughts you must seek medical attention immediately by calling 911 or  calling your MD immediately  if symptoms less severe.  You must read complete instructions/literature along with all the possible adverse reactions/side effects for all the Medicines you take and that have been prescribed to you. Take any new Medicines after you have completely understood and accpet all the possible adverse reactions/side effects.   Do not drive when taking Pain medications or sleeping medications (Benzodaizepines)  Do not take more than prescribed Pain, Sleep and Anxiety Medications. It is not advisable to combine anxiety,sleep and pain medications without talking with your primary care practitioner  Special Instructions: If you have smoked or chewed Tobacco  in the last 2 yrs please stop smoking, stop any regular Alcohol  and or any Recreational drug use.  Wear Seat belts while driving.  Please note: You were cared for by a hospitalist during your hospital stay. Once you are discharged, your primary care physician will handle any further medical issues. Please note that NO REFILLS for any discharge medications will be authorized once you are discharged, as it is imperative that you return to your primary care physician (or establish a relationship with a primary care physician if you do not have one) for your post hospital discharge needs so that they can reassess your need for medications and monitor your lab values.  Total Time spent coordinating discharge including counseling, education and face to face time equals 35 minutes.  SignedJeoffrey Massed: Patricia Mckinney 04/09/2020 1:02 PM

## 2020-04-09 NOTE — ED Notes (Signed)
Dr. Manus Gunning made aware of patient's BP. Patient asymptomatic with same. Bolus infusing. Awaiting blood.

## 2020-04-10 LAB — BPAM RBC
Blood Product Expiration Date: 202205012359
Blood Product Expiration Date: 202205042359
ISSUE DATE / TIME: 202204030216
ISSUE DATE / TIME: 202204030655
Unit Type and Rh: 5100
Unit Type and Rh: 5100

## 2020-04-10 LAB — TYPE AND SCREEN
ABO/RH(D): O POS
Antibody Screen: NEGATIVE
Unit division: 0
Unit division: 0

## 2020-04-11 NOTE — Discharge Summary (Addendum)
PATIENT DETAILS Name: Patricia Mckinney Age: 35 y.o. Sex: female Date of Birth: 01-25-1985 MRN: [id number]. Admitting Physician: Hillary Bow, DO FYB:[id number], No Pcp Per (Inactive)  Admit Date: 04/08/2020 Discharge date: 04/09/2020  Recommendations for Outpatient Follow-up:  1. Follow up with PCP in 1-2 weeks 2. Please obtain CMP/CBC in one week   Admitted From:  Home   Disposition: Home   Home Health: No  Equipment/Devices: None  Discharge Condition: Stable  CODE STATUS: FULL CODE  Diet recommendation:  Diet Order            Diet general                  Brief Summary: See H&P, Labs, Consult and Test reports for all details in brief, patient is a 35 year old female with history of asthma-who had elective liposuction surgery on 3/28 [place]-starting on 3/30-she started having mild exertional dyspnea and palpitation but continued to worsen-she subsequently presented to the ED on 4/3-she was found to have hemoglobin of 7.3.  She was subsequently admitted to the hospitalist service for further evaluation and treatment.  Brief Hospital Course: Acute perioperative blood loss anemia: Per patient history-preoperatively her hemoglobin was 12-her symptoms related to anemia began on the second postoperative day per patient.  She was admitted-and transfused 2 units of PRBC-hemoglobin now stable-and she was symptomatically better after PRBC transfusion with hardly any shortness of breath.  She will require repeat CBC at PCPs office in 1 week.  She has no history of menorrhagia-FOBT was negative.  There is no other source for blood loss-no any other explanation for anemia.  CTA chest/abdomen was negative for PE-showed diffuse edema and scattered gas collection in the subcutaneous fat-likely a sequelae of her recent surgery.  She is to follow with her plastic surgeon as previously scheduled.  She apparently was put on prophylactic Lovenox for 7 days-however she does not intend to  take it anymore-there is no evidence of active blood loss.  She is to resume her postoperative medications including Bactrim as previously prescribed.    Asthma: Stable  Obesity: Estimated body mass index is 34.6 kg/m as calculated from the following:   Height as of this encounter: 5\' 3"  (1.6 m).   Weight as of this encounter: 88.6 kg.    Discharge Diagnoses:  Principal Problem:   Symptomatic anemia   Discharge Instructions:  Activity:  As tolerated   Discharge Instructions    Call MD for:  difficulty breathing, headache or visual disturbances   Complete by: As directed    Call MD for:  redness, tenderness, or signs of infection (pain, swelling, redness, odor or green/yellow discharge around incision site)   Complete by: As directed    Diet general   Complete by: As directed    Discharge instructions   Complete by: As directed    Follow with Primary MD repeat CBC in 1 week  Follow with your plastic surgeon as previously instructed.  Please get a complete blood count and chemistry panel checked by your Primary MD at your next visit, and again as instructed by your Primary MD.  Get Medicines reviewed and adjusted: Please take all your medications with you for your next visit with your Primary MD  Laboratory/radiological data: Please request your Primary MD to go over all hospital tests and procedure/radiological results at the follow up, please ask your Primary MD to get all Hospital records sent to his/her office.  In some cases, they will be blood work,  cultures and biopsy results pending at the time of your discharge. Please request that your primary care M.D. follows up on these results.  Also Note the following: If you experience worsening of your admission symptoms, develop shortness of breath, life threatening emergency, suicidal or homicidal thoughts you must seek medical attention immediately by calling 911 or calling your MD immediately  if symptoms less  severe.  You must read complete instructions/literature along with all the possible adverse reactions/side effects for all the Medicines you take and that have been prescribed to you. Take any new Medicines after you have completely understood and accpet all the possible adverse reactions/side effects.   Do not drive when taking Pain medications or sleeping medications (Benzodaizepines)  Do not take more than prescribed Pain, Sleep and Anxiety Medications. It is not advisable to combine anxiety,sleep and pain medications without talking with your primary care practitioner  Special Instructions: If you have smoked or chewed Tobacco  in the last 2 yrs please stop smoking, stop any regular Alcohol  and or any Recreational drug use.  Wear Seat belts while driving.  Please note: You were cared for by a hospitalist during your hospital stay. Once you are discharged, your primary care physician will handle any further medical issues. Please note that NO REFILLS for any discharge medications will be authorized once you are discharged, as it is imperative that you return to your primary care physician (or establish a relationship with a primary care physician if you do not have one) for your post hospital discharge needs so that they can reassess your need for medications and monitor your lab values.   Increase activity slowly   Complete by: As directed      Allergies as of 04/09/2020      Reactions   Shellfish Allergy Anaphylaxis      Medication List    TAKE these medications   acetaminophen 325 MG tablet Commonly known as: TYLENOL Take 2 tablets (650 mg total) by mouth every 6 (six) hours as needed for mild pain or moderate pain.   albuterol 108 (90 Base) MCG/ACT inhaler Commonly known as: VENTOLIN HFA Inhale 2 puffs into the lungs every 4 (four) hours as needed for wheezing or shortness of breath.   enoxaparin 40 MG/0.4ML injection Commonly known as: LOVENOX Inject 40 mg into the skin See  admin instructions. Qd x 7 days   ferrous sulfate 325 (65 FE) MG tablet Take 325 mg by mouth daily with breakfast.   fluconazole 150 MG tablet Commonly known as: DIFLUCAN Take 150 mg by mouth once.   folic acid 400 MCG tablet Commonly known as: FOLVITE Take 400 mcg by mouth daily.   montelukast 10 MG tablet Commonly known as: SINGULAIR Take 10 mg by mouth at bedtime.   multivitamin with minerals Tabs tablet Take 1 tablet by mouth daily.   omeprazole 20 MG capsule Commonly known as: PRILOSEC Take 1 capsule (20 mg total) by mouth daily. What changed:   when to take this  reasons to take this   ondansetron 4 MG disintegrating tablet Commonly known as: Zofran ODT Take 1 tablet (4 mg total) by mouth every 8 (eight) hours as needed for nausea or vomiting.   oxyCODONE-acetaminophen 5-325 MG tablet Commonly known as: PERCOCET/ROXICET Take 1 tablet by mouth every 4 (four) hours as needed for moderate pain or severe pain.   sulfamethoxazole-trimethoprim 800-160 MG tablet Commonly known as: BACTRIM DS Take 1 tablet by mouth See admin instructions. Bid x 7  days       Follow-up Information    Primary care MD. Schedule an appointment as soon as possible for a visit in 1 week(s).              Allergies  Allergen Reactions  . Shellfish Allergy Anaphylaxis     Other Procedures/Studies: CT Angio Chest PE W and/or Wo Contrast  Result Date: 04/09/2020 CLINICAL DATA:  Acute abdominal pain after liposuction 5 days ago. Shortness of breath and lightheadedness. EXAM: CT ANGIOGRAPHY CHEST CT ABDOMEN AND PELVIS WITH CONTRAST TECHNIQUE: Multidetector CT imaging of the chest was performed using the standard protocol during bolus administration of intravenous contrast. Multiplanar CT image reconstructions and MIPs were obtained to evaluate the vascular anatomy. Multidetector CT imaging of the abdomen and pelvis was performed using the standard protocol during bolus administration of  intravenous contrast. CONTRAST:  OMNIPAQUE IOHEXOL 350 MG/ML SOLN COMPARISON:  None. FINDINGS: CTA CHEST FINDINGS Cardiovascular: Satisfactory opacification of the pulmonary arteries to the segmental level. No evidence of pulmonary embolism. Normal heart size. No pericardial effusion. Normal caliber thoracic aorta. No aortic dissection. Great vessel origins are patent. Mediastinum/Nodes: No enlarged mediastinal, hilar, or axillary lymph nodes. Thyroid gland, trachea, and esophagus demonstrate no significant findings. Lungs/Pleura: Lungs are clear. No pleural effusion or pneumothorax. Musculoskeletal: No chest wall abnormality. No acute or significant osseous findings. Review of the MIP images confirms the above findings. CT ABDOMEN and PELVIS FINDINGS Hepatobiliary: No focal liver abnormality is seen. No gallstones, gallbladder wall thickening, or biliary dilatation. Pancreas: Unremarkable. No pancreatic ductal dilatation or surrounding inflammatory changes. Spleen: Normal in size without focal abnormality. Adrenals/Urinary Tract: Adrenal glands are unremarkable. Kidneys are normal, without renal calculi, focal lesion, or hydronephrosis. Bladder is unremarkable. Stomach/Bowel: Stomach is within normal limits. Appendix appears normal. No evidence of bowel wall thickening, distention, or inflammatory changes. Vascular/Lymphatic: No significant vascular findings are present. No enlarged abdominal or pelvic lymph nodes. Reproductive: Uterus and ovaries are not enlarged. Involuting follicle in the right ovary. Small amount of free fluid in the pelvis is probably physiologic. Other: Diffuse edema and scattered gas collections in the subcutaneous fat throughout the abdominal wall. This is most likely postoperative but could also represent edema or cellulitis. Correlate with physical examination. Probable focal loculation in the right flank region measuring 3.7 x 6.1 cm. This is likely postoperative seroma.  Musculoskeletal: No acute or significant osseous findings. Review of the MIP images confirms the above findings. IMPRESSION: 1. No evidence of significant pulmonary embolus. 2. No evidence of active pulmonary disease. 3. No acute process demonstrated in the abdomen or pelvis. 4. Diffuse edema and scattered gas collections in the subcutaneous fat throughout the abdominal wall. This is likely postoperative change but could also could represent edema or cellulitis. Correlate with physical examination. 5. Probable focal loculation in the right flank region measuring 3.7 x 6.1 cm. This is likely postoperative seroma. Electronically Signed   By: Burman Nieves M.D.   On: 04/09/2020 02:12   CT ABDOMEN PELVIS W CONTRAST  Result Date: 04/09/2020 CLINICAL DATA:  Acute abdominal pain after liposuction 5 days ago. Shortness of breath and lightheadedness. EXAM: CT ANGIOGRAPHY CHEST CT ABDOMEN AND PELVIS WITH CONTRAST TECHNIQUE: Multidetector CT imaging of the chest was performed using the standard protocol during bolus administration of intravenous contrast. Multiplanar CT image reconstructions and MIPs were obtained to evaluate the vascular anatomy. Multidetector CT imaging of the abdomen and pelvis was performed using the standard protocol during bolus administration of intravenous  contrast. CONTRAST:  100mL OMNIPAQUE IOHEXOL 350 MG/ML SOLN COMPARISON:  None. FINDINGS: CTA CHEST FINDINGS Cardiovascular: Satisfactory opacification of the pulmonary arteries to the segmental level. No evidence of pulmonary embolism. Normal heart size. No pericardial effusion. Normal caliber thoracic aorta. No aortic dissection. Great vessel origins are patent. Mediastinum/Nodes: No enlarged mediastinal, hilar, or axillary lymph nodes. Thyroid gland, trachea, and esophagus demonstrate no significant findings. Lungs/Pleura: Lungs are clear. No pleural effusion or pneumothorax. Musculoskeletal: No chest wall abnormality. No acute or significant  osseous findings. Review of the MIP images confirms the above findings. CT ABDOMEN and PELVIS FINDINGS Hepatobiliary: No focal liver abnormality is seen. No gallstones, gallbladder wall thickening, or biliary dilatation. Pancreas: Unremarkable. No pancreatic ductal dilatation or surrounding inflammatory changes. Spleen: Normal in size without focal abnormality. Adrenals/Urinary Tract: Adrenal glands are unremarkable. Kidneys are normal, without renal calculi, focal lesion, or hydronephrosis. Bladder is unremarkable. Stomach/Bowel: Stomach is within normal limits. Appendix appears normal. No evidence of bowel wall thickening, distention, or inflammatory changes. Vascular/Lymphatic: No significant vascular findings are present. No enlarged abdominal or pelvic lymph nodes. Reproductive: Uterus and ovaries are not enlarged. Involuting follicle in the right ovary. Small amount of free fluid in the pelvis is probably physiologic. Other: Diffuse edema and scattered gas collections in the subcutaneous fat throughout the abdominal wall. This is most likely postoperative but could also represent edema or cellulitis. Correlate with physical examination. Probable focal loculation in the right flank region measuring 3.7 x 6.1 cm. This is likely postoperative seroma. Musculoskeletal: No acute or significant osseous findings. Review of the MIP images confirms the above findings. IMPRESSION: 1. No evidence of significant pulmonary embolus. 2. No evidence of active pulmonary disease. 3. No acute process demonstrated in the abdomen or pelvis. 4. Diffuse edema and scattered gas collections in the subcutaneous fat throughout the abdominal wall. This is likely postoperative change but could also could represent edema or cellulitis. Correlate with physical examination. 5. Probable focal loculation in the right flank region measuring 3.7 x 6.1 cm. This is likely postoperative seroma. Electronically Signed   By: Burman NievesWilliam  Stevens M.D.   On:  04/09/2020 02:12   DG Chest Port 1 View  Result Date: 04/08/2020 CLINICAL DATA:  Shortness of breath. Dizziness, fatigue, and rapid heart rate. Surgery on Monday. EXAM: PORTABLE CHEST 1 VIEW COMPARISON:  None. FINDINGS: The heart size and mediastinal contours are within normal limits. Both lungs are clear. The visualized skeletal structures are unremarkable. IMPRESSION: No active disease. Electronically Signed   By: Burman NievesWilliam  Stevens M.D.   On: 04/08/2020 21:21     TODAY-DAY OF DISCHARGE:  Subjective:   Albesa Seenequila Rollyson today has no headache,no chest abdominal pain,no new weakness tingling or numbness, feels much better wants to go home today.   Objective:   Blood pressure (!) 98/59, pulse 90, temperature 98.4 F (36.9 C), temperature source Oral, resp. rate 16, height 5\' 3"  (1.6 m), weight 88.6 kg, SpO2 99 %. No intake or output data in the 24 hours ending 04/11/20 1452 Filed Weights   04/08/20 2045 04/09/20 0605  Weight: 86.2 kg 88.6 kg    Exam: Awake Alert, Oriented *3, No new F.N deficits, Normal affect [place].AT,PERRAL Supple Neck,No JVD, No cervical lymphadenopathy appriciated.  Symmetrical Chest wall movement, Good air movement bilaterally, CTAB RRR,No Gallops,Rubs or new Murmurs, No Parasternal Heave +ve B.Sounds, Abd Soft, Non tender, No organomegaly appriciated, No rebound -guarding or rigidity. No Cyanosis, Clubbing or edema, No new Rash or bruise   PERTINENT RADIOLOGIC  STUDIES: No results found.   PERTINENT LAB RESULTS: CBC: Recent Labs    04/08/20 2050 04/09/20 1204  WBC 7.1  --   HGB 7.3* 9.3*  HCT 23.3* 27.9*  PLT 303  --    CMET CMP     Component Value Date/Time   NA 135 04/08/2020 2050   K 3.8 04/08/2020 2050   CL 103 04/08/2020 2050   CO2 27 04/08/2020 2050   GLUCOSE 105 (H) 04/08/2020 2050   BUN 9 04/08/2020 2050   CREATININE 0.87 04/08/2020 2050   CALCIUM 9.6 04/08/2020 2050   PROT 6.7 07/11/2019 0351   ALBUMIN 3.6 07/11/2019 0351   AST 16  07/11/2019 0351   ALT 14 07/11/2019 0351   ALKPHOS 37 (L) 07/11/2019 0351   BILITOT 0.6 07/11/2019 0351   GFRNONAA >60 04/08/2020 2050   GFRAA >60 07/11/2019 0351    GFR Estimated Creatinine Clearance: 94.4 mL/min (by C-G formula based on SCr of 0.87 mg/dL). No results for input(s): LIPASE, AMYLASE in the last 72 hours. No results for input(s): CKTOTAL, CKMB, CKMBINDEX, TROPONINI in the last 72 hours. Invalid input(s): POCBNP Recent Labs    04/08/20 2050  DDIMER 3.18*   No results for input(s): HGBA1C in the last 72 hours. No results for input(s): CHOL, HDL, LDLCALC, TRIG, CHOLHDL, LDLDIRECT in the last 72 hours. Recent Labs    04/08/20 2051  TSH 3.217   Recent Labs    04/08/20 2358  VITAMINB12 >7,500*  FOLATE 14.8  FERRITIN 186  TIBC 276  IRON 80  RETICCTPCT 5.8*   Coags: No results for input(s): INR in the last 72 hours.  Invalid input(s): PT Microbiology: Recent Results (from the past 240 hour(s))  Resp Panel by RT-PCR (Flu A&B, Covid) Nasopharyngeal Swab     Status: None   Collection Time: 04/09/20  2:47 AM   Specimen: Nasopharyngeal Swab; Nasopharyngeal(NP) swabs in vial transport medium  Result Value Ref Range Status   SARS Coronavirus 2 by RT PCR NEGATIVE NEGATIVE Final    Comment: (NOTE) SARS-CoV-2 target nucleic acids are NOT DETECTED.  The SARS-CoV-2 RNA is generally detectable in upper respiratory specimens during the acute phase of infection. The lowest concentration of SARS-CoV-2 viral copies this assay can detect is 138 copies/mL. A negative result does not preclude SARS-Cov-2 infection and should not be used as the sole basis for treatment or other patient management decisions. A negative result may occur with  improper specimen collection/handling, submission of specimen other than nasopharyngeal swab, presence of viral mutation(s) within the areas targeted by this assay, and inadequate number of viral copies(<138 copies/mL). A negative result  must be combined with clinical observations, patient history, and epidemiological information. The expected result is Negative.  Fact Sheet for Patients:  BloggerCourse.com  Fact Sheet for Healthcare Providers:  SeriousBroker.it  This test is no t yet approved or cleared by the [place] FDA and  has been authorized for detection and/or diagnosis of SARS-CoV-2 by FDA under an Emergency Use Authorization (EUA). This EUA will remain  in effect (meaning this test can be used) for the duration of the COVID-19 declaration under Section 564(b)(1) of the Act, 21 U.S.C.section 360bbb-3(b)(1), unless the authorization is terminated  or revoked sooner.       Influenza A by PCR NEGATIVE NEGATIVE Final   Influenza B by PCR NEGATIVE NEGATIVE Final    Comment: (NOTE) The Xpert Xpress SARS-CoV-2/FLU/RSV plus assay is intended as an aid in the diagnosis of influenza from Nasopharyngeal swab  specimens and should not be used as a sole basis for treatment. Nasal washings and aspirates are unacceptable for Xpert Xpress SARS-CoV-2/FLU/RSV testing.  Fact Sheet for Patients: BloggerCourse.com  Fact Sheet for Healthcare Providers: SeriousBroker.it  This test is not yet approved or cleared by the [place] FDA and has been authorized for detection and/or diagnosis of SARS-CoV-2 by FDA under an Emergency Use Authorization (EUA). This EUA will remain in effect (meaning this test can be used) for the duration of the COVID-19 declaration under Section 564(b)(1) of the Act, 21 U.S.C. section 360bbb-3(b)(1), unless the authorization is terminated or revoked.  Performed at [facility], [address] [place], [place] [address]     FURTHER DISCHARGE INSTRUCTIONS:  Get Medicines reviewed and adjusted: Please take all your medications with you for your next visit with your Primary  MD  Laboratory/radiological data: Please request your Primary MD to go over all hospital tests and procedure/radiological results at the follow up, please ask your Primary MD to get all Hospital records sent to his/her office.  In some cases, they will be blood work, cultures and biopsy results pending at the time of your discharge. Please request that your primary care M.D. goes through all the records of your hospital data and follows up on these results.  Also Note the following: If you experience worsening of your admission symptoms, develop shortness of breath, life threatening emergency, suicidal or homicidal thoughts you must seek medical attention immediately by calling 911 or calling your MD immediately  if symptoms less severe.  You must read complete instructions/literature along with all the possible adverse reactions/side effects for all the Medicines you take and that have been prescribed to you. Take any new Medicines after you have completely understood and accpet all the possible adverse reactions/side effects.   Do not drive when taking Pain medications or sleeping medications (Benzodaizepines)  Do not take more than prescribed Pain, Sleep and Anxiety Medications. It is not advisable to combine anxiety,sleep and pain medications without talking with your primary care practitioner  Special Instructions: If you have smoked or chewed Tobacco  in the last 2 yrs please stop smoking, stop any regular Alcohol  and or any Recreational drug use.  Wear Seat belts while driving.  Please note: You were cared for by a hospitalist during your hospital stay. Once you are discharged, your primary care physician will handle any further medical issues. Please note that NO REFILLS for any discharge medications will be authorized once you are discharged, as it is imperative that you return to your primary care physician (or establish a relationship with a primary care physician if you do not have  one) for your post hospital discharge needs so that they can reassess your need for medications and monitor your lab values.  Total Time spent coordinating discharge including counseling, education and face to face time equals 35 minutes.  SignedJeoffrey Massed 04/11/2020 2:52 PM

## 2020-07-05 IMAGING — US US PELVIS COMPLETE TRANSABD/TRANSVAG W DUPLEX
1 series · 13 of 25 positions shown · non-contrast
Comparison: None.

CLINICAL DATA: Initial evaluation for acute left lower quadrant
pain.

EXAM:
TRANSABDOMINAL AND TRANSVAGINAL ULTRASOUND OF PELVIS
DOPPLER ULTRASOUND OF OVARIES
TECHNIQUE: Both transabdominal and transvaginal ultrasound examinations of the
pelvis were performed. Transabdominal technique was performed for
global imaging of the pelvis including uterus, ovaries, adnexal
regions, and pelvic cul-de-sac.
It was necessary to proceed with endovaginal exam following the
transabdominal exam to visualize the uterus, endometrium, and
ovaries. Color and duplex Doppler ultrasound was utilized to
evaluate blood flow to the ovaries.

[Series 1: us pelvic complete w transvaginal and torsion righ · 13 of 96 slices shown]
[im 1/96]
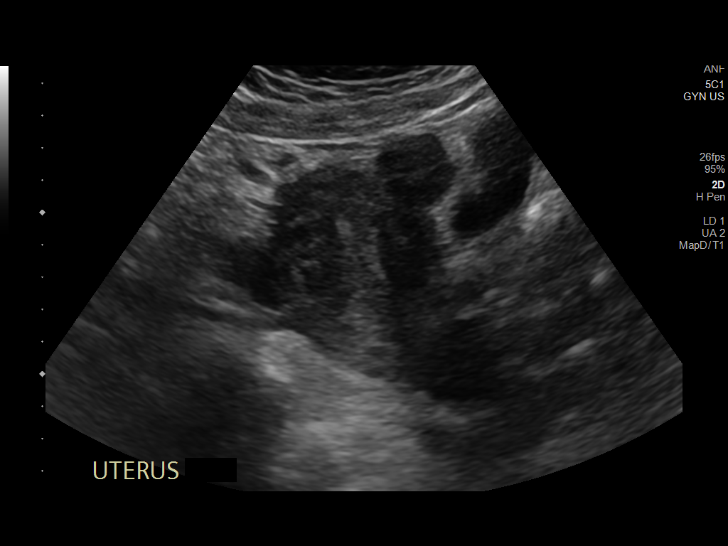
[im 8/96]
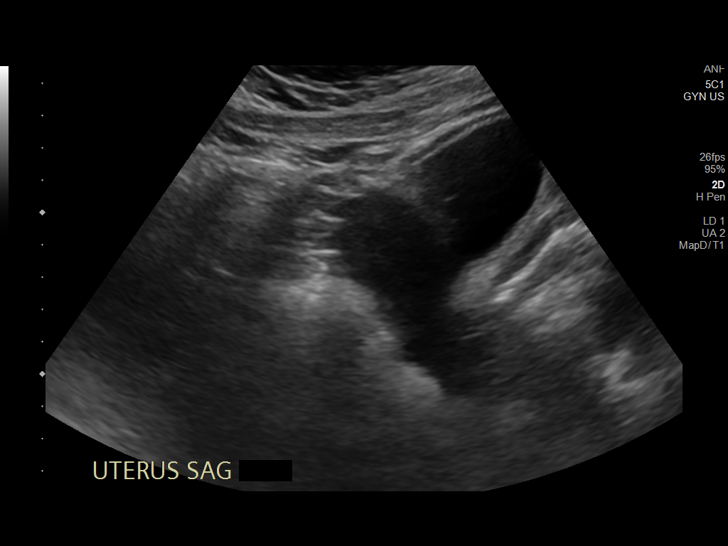
[im 16/96]
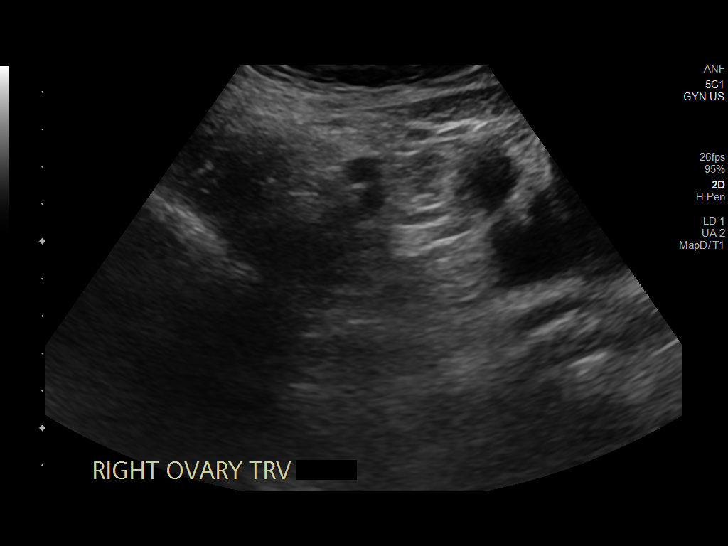
[im 24/96]
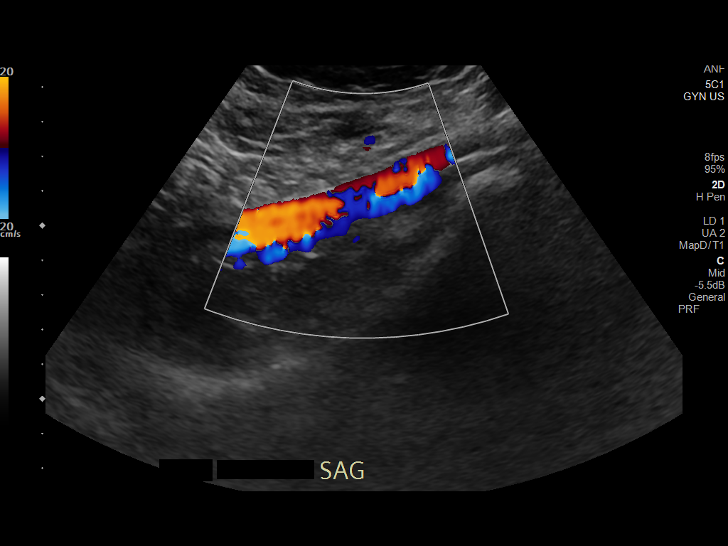
[im 32/96]
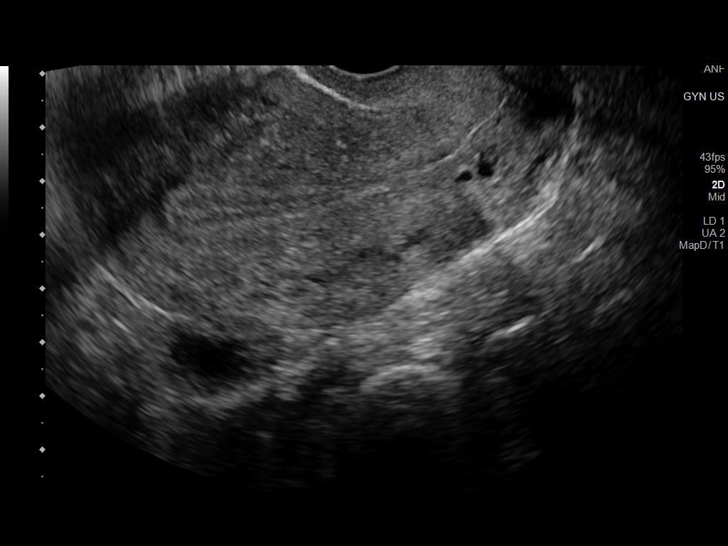
[im 40/96]
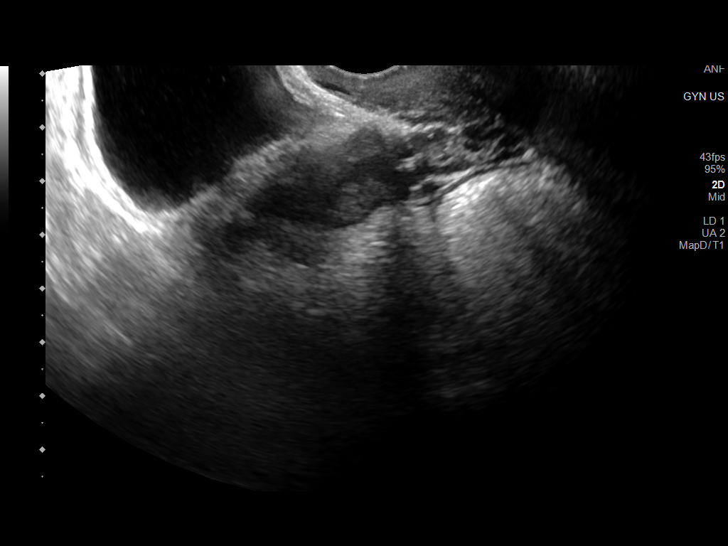
[im 48/96]
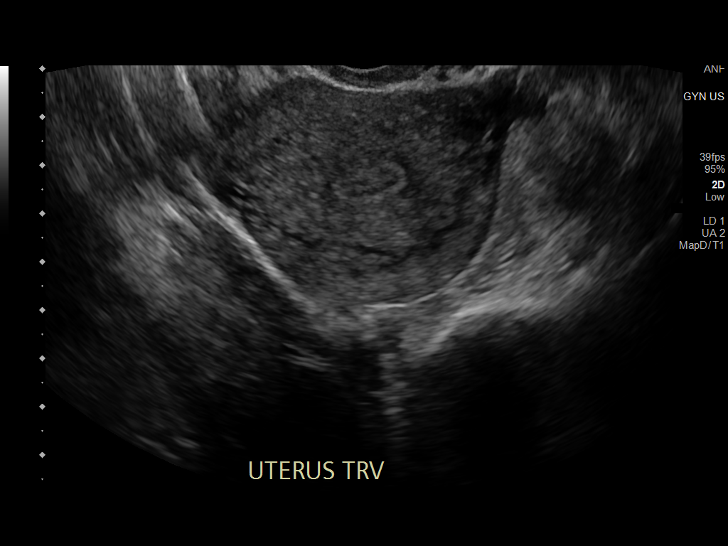
[im 56/96]
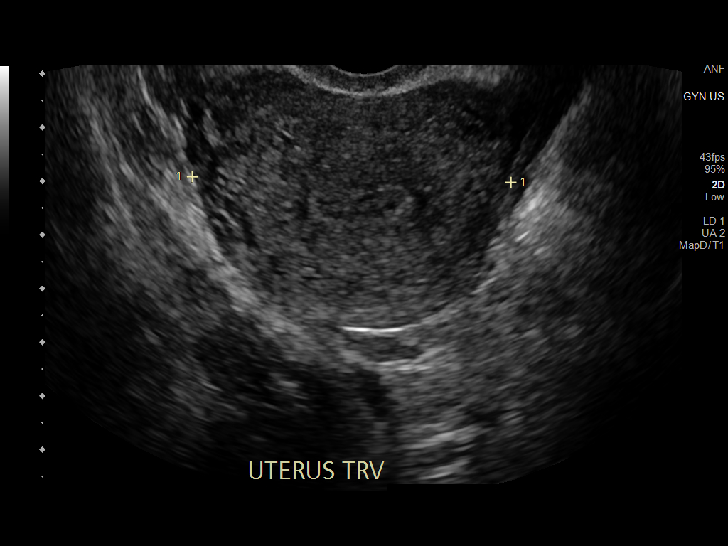
[im 64/96]
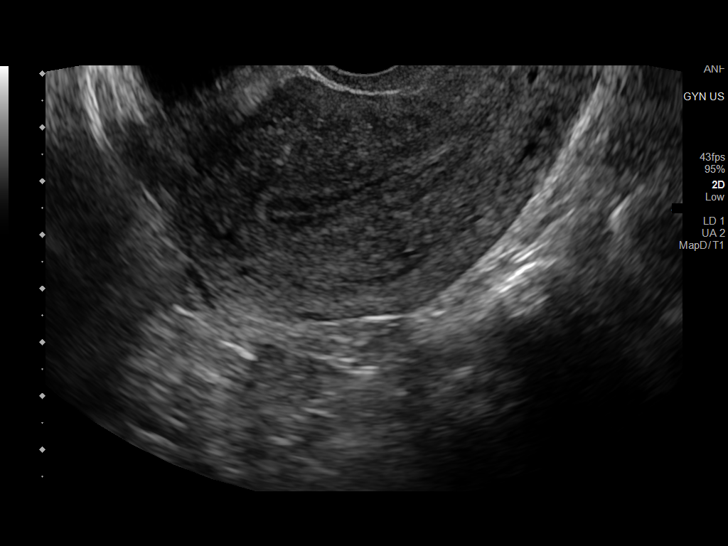
[im 72/96]
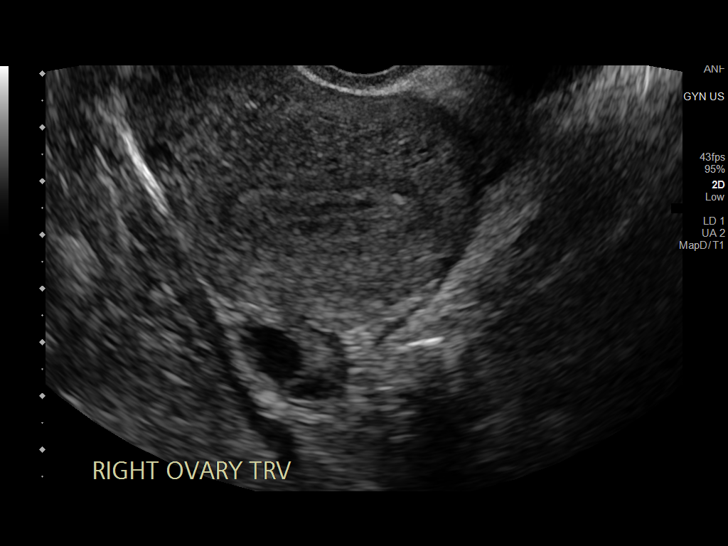
[im 80/96]
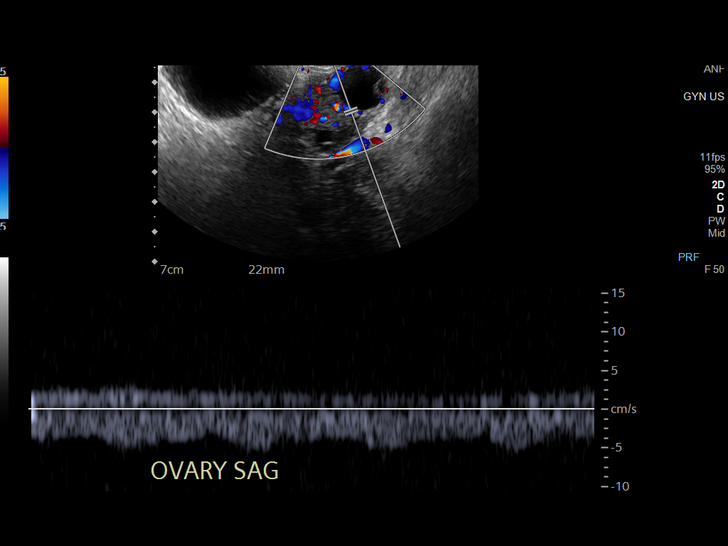
[im 88/96]
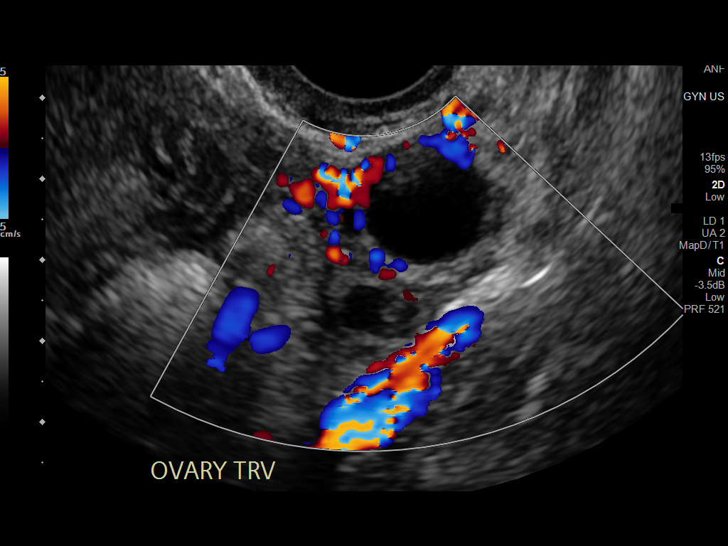
[im 96/96]
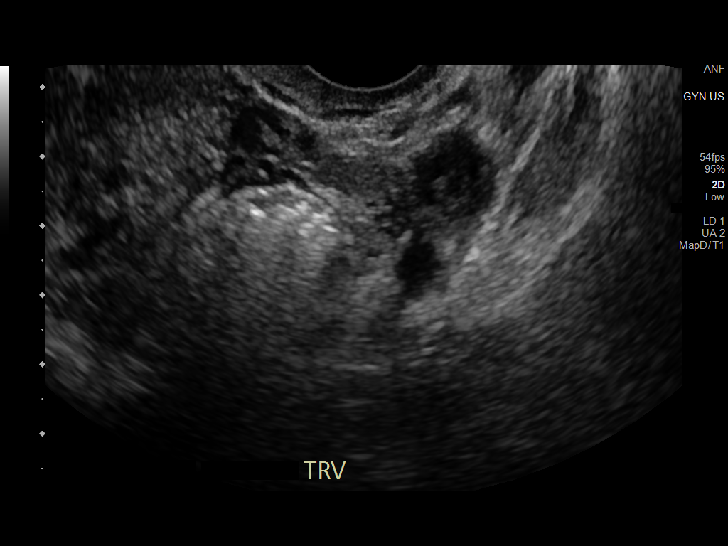

[13 of 25 positions shown; findings below may reference images not displayed]

FINDINGS: Uterus

Measurements: 9.4 x 5.0 x 5.9 cm = volume: 145 mL. 2.8 x 2.3 x
cm exophytic fibroid extends from the anterior uterine fundus.

Endometrium

Thickness: 10.6 mm.  No focal abnormality visualized.

Right ovary

Measurements: 3.2 x 1.9 x 2.0 cm = volume: 6.3 mL. 1.2 x 0.8 x
cm simple cyst, most consistent with a normal physiologic follicular
cyst/dominant follicle. No other adnexal mass.

Left ovary

Measurements: 3.5 x 2.1 x 2.2 cm = volume: 8.3 mL. 1.5 x 1.3 x
cm cyst with peripherally increased vascularity, consistent with a
degenerating corpus luteal cyst. Small internal daughter cyst noted.
No other adnexal mass.

Pulsed Doppler evaluation of both ovaries demonstrates normal
low-resistance arterial and venous waveforms.

Other findings

No abnormal free fluid.
IMPRESSION: 1. 2.2 cm degenerating left ovarian corpus luteal cyst, which could
contribute to left lower quadrant pain. No evidence for ovarian
torsion.
2. 2.8 cm exophytic fibroid extending from the uterine fundus.
3. No other acute abnormality identified.

## 2021-04-04 IMAGING — CT CT ABD-PELV W/ CM
2 of 12 series · 11 of 46 positions shown, 17 images · IV contrast (omnipaque)
Comparison: None.

CLINICAL DATA: Acute abdominal pain after liposuction 5 days ago.
Shortness of breath and lightheadedness.

EXAM:
CT ANGIOGRAPHY CHEST
CT ABDOMEN AND PELVIS WITH CONTRAST
TECHNIQUE: Multidetector CT imaging of the chest was performed using the
standard protocol during bolus administration of intravenous
contrast. Multiplanar CT image reconstructions and MIPs were
obtained to evaluate the vascular anatomy. Multidetector CT imaging
of the abdomen and pelvis was performed using the standard protocol
during bolus administration of intravenous contrast.
CONTRAST:  100mL OMNIPAQUE IOHEXOL 350 MG/ML SOLN

[Series 8: coronal mpr · coronal · 0.54mm/px · 1 of 98 slices shown, 2 images]
[im 49/98  soft-tissue]
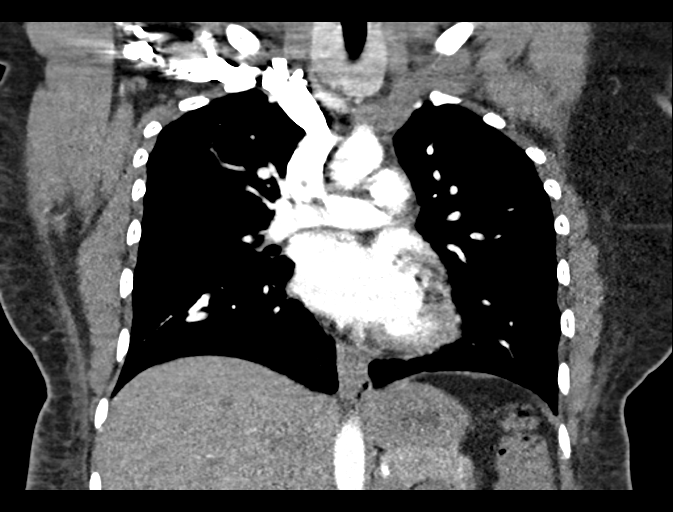
[im 49/98  bone]
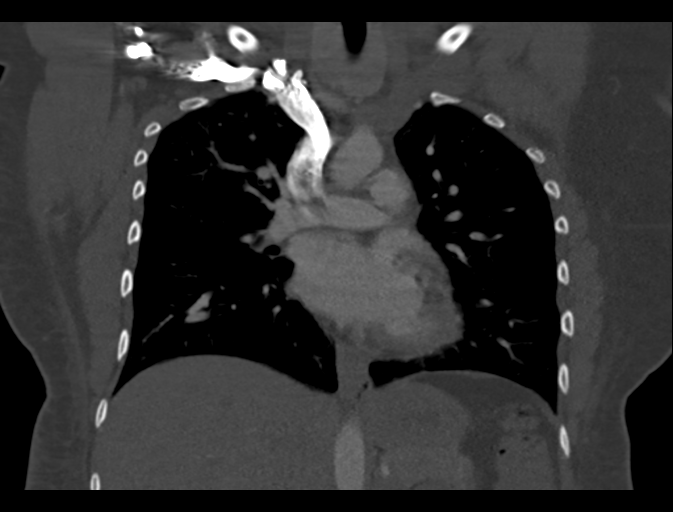

[Series 14: thins · axial · 0.86mm/px · z∈[+862,+1238]mm · 10 of 440 slices shown, 15 images]
[im 32/440  soft-tissue]
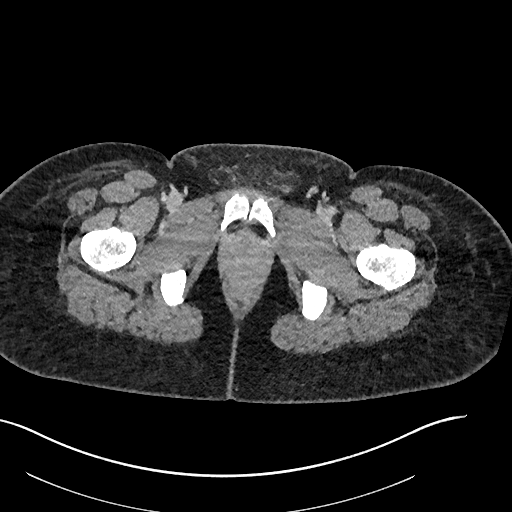
[im 32/440  bone]
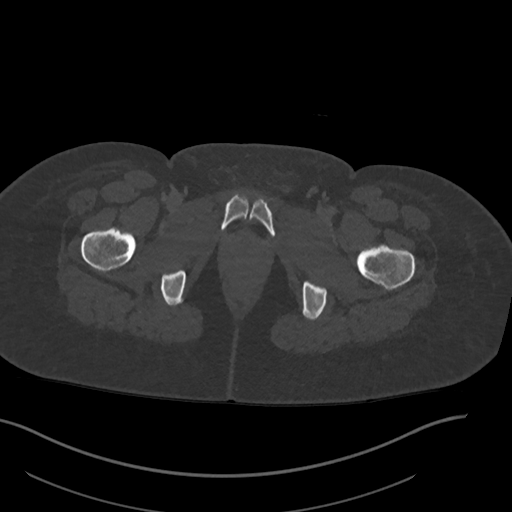
[im 95/440  soft-tissue]
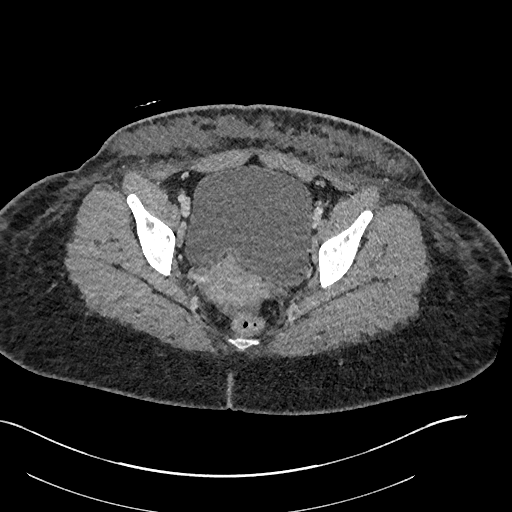
[im 126/440  soft-tissue]
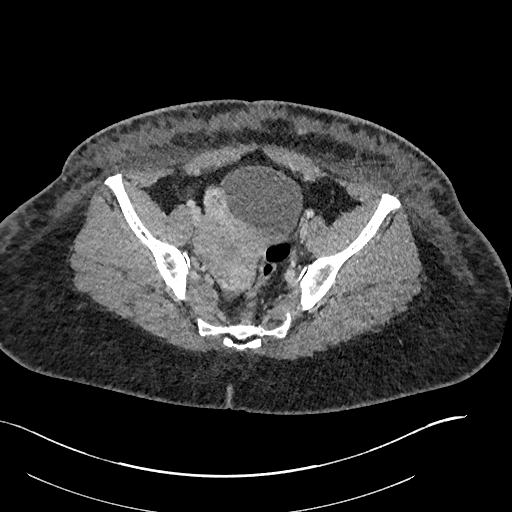
[im 189/440  soft-tissue]
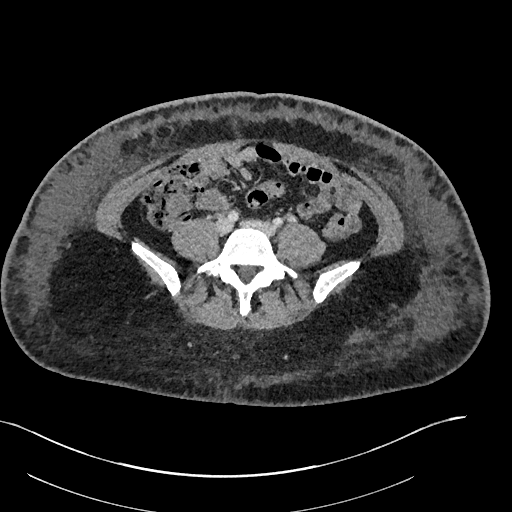
[im 220/440  soft-tissue]
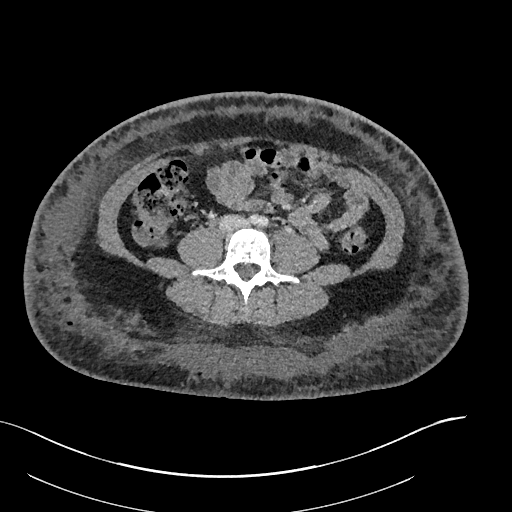
[im 251/440  soft-tissue]
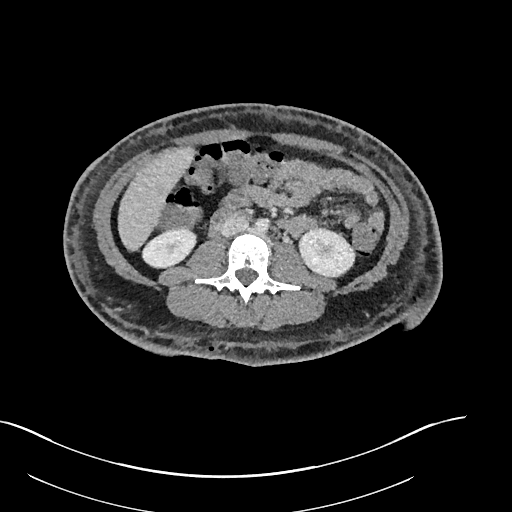
[im 314/440  soft-tissue]
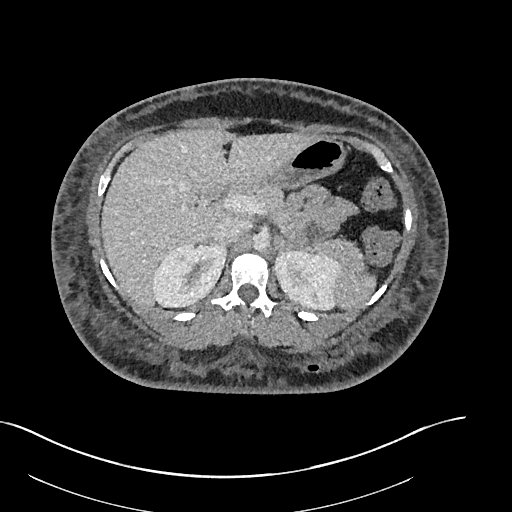
[im 314/440  lung]
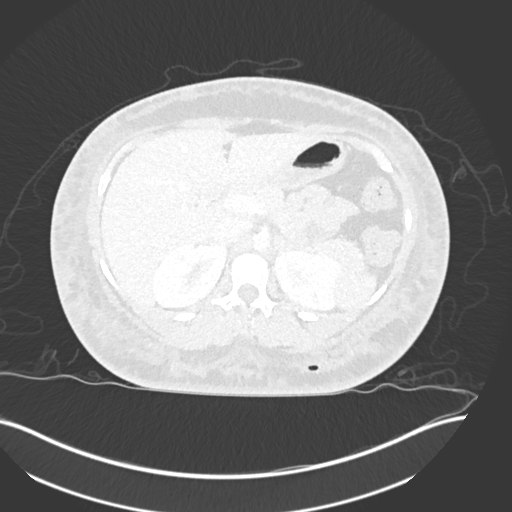
[im 345/440  soft-tissue]
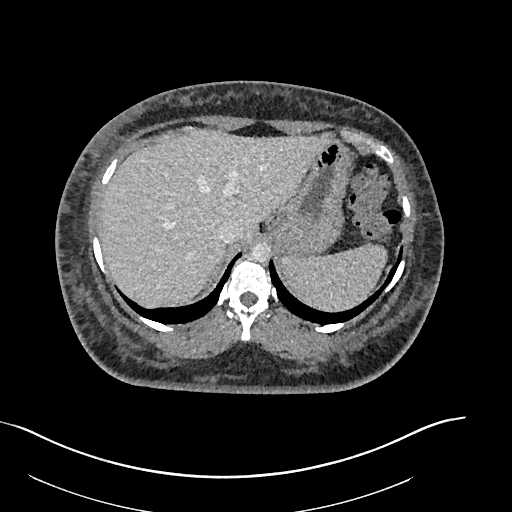
[im 345/440  lung]
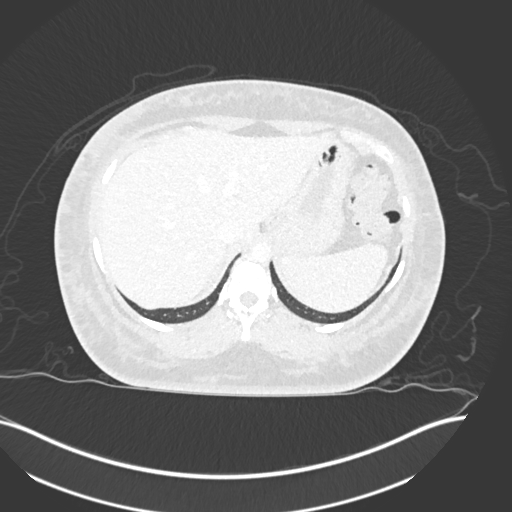
[im 377/440  lung]
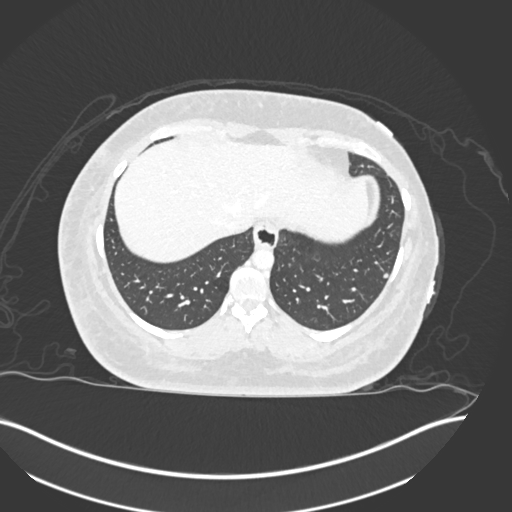
[im 408/440  soft-tissue]
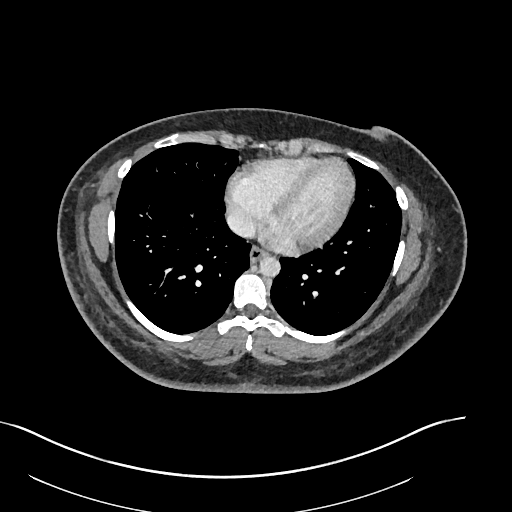
[im 408/440  lung]
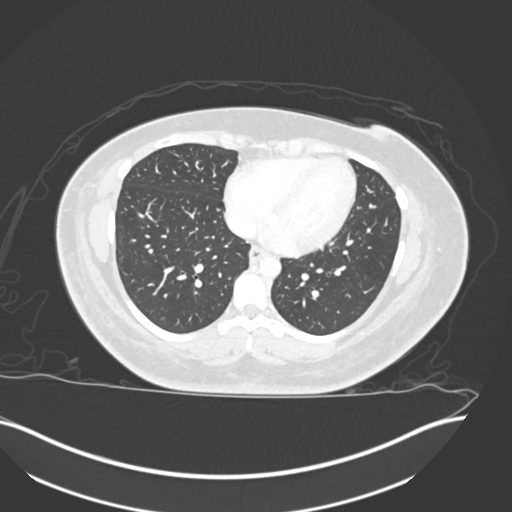
[im 408/440  bone]
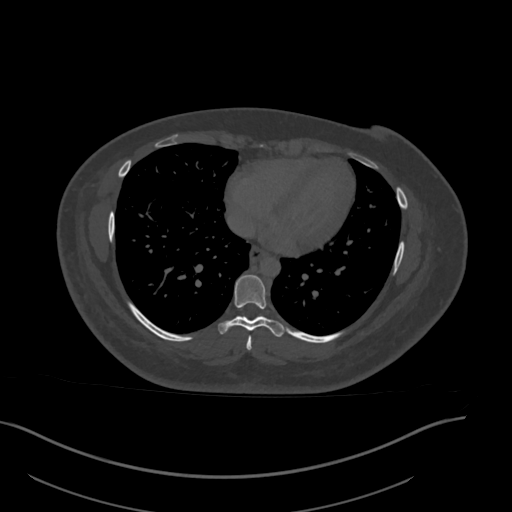

[11 of 46 positions shown; findings below may reference images not displayed]

FINDINGS: CTA CHEST FINDINGS

Cardiovascular: Satisfactory opacification of the pulmonary arteries
to the segmental level. No evidence of pulmonary embolism. Normal
heart size. No pericardial effusion. Normal caliber thoracic aorta.
No aortic dissection. Great vessel origins are patent.

Mediastinum/Nodes: No enlarged mediastinal, hilar, or axillary lymph
nodes. Thyroid gland, trachea, and esophagus demonstrate no
significant findings.

Lungs/Pleura: Lungs are clear. No pleural effusion or pneumothorax.

Musculoskeletal: No chest wall abnormality. No acute or significant
osseous findings.

Review of the MIP images confirms the above findings.

CT ABDOMEN and PELVIS FINDINGS

Hepatobiliary: No focal liver abnormality is seen. No gallstones,
gallbladder wall thickening, or biliary dilatation.

Pancreas: Unremarkable. No pancreatic ductal dilatation or
surrounding inflammatory changes.

Spleen: Normal in size without focal abnormality.

Adrenals/Urinary Tract: Adrenal glands are unremarkable. Kidneys are
normal, without renal calculi, focal lesion, or hydronephrosis.
Bladder is unremarkable.

Stomach/Bowel: Stomach is within normal limits. Appendix appears
normal. No evidence of bowel wall thickening, distention, or
inflammatory changes.

Vascular/Lymphatic: No significant vascular findings are present. No
enlarged abdominal or pelvic lymph nodes.

Reproductive: Uterus and ovaries are not enlarged. Involuting
follicle in the right ovary. Small amount of free fluid in the
pelvis is probably physiologic.

Other: Diffuse edema and scattered gas collections in the
subcutaneous fat throughout the abdominal wall. This is most likely
postoperative but could also represent edema or cellulitis.
Correlate with physical examination. Probable focal loculation in
the right flank region measuring 3.7 x 6.1 cm. This is likely
postoperative seroma.

Musculoskeletal: No acute or significant osseous findings.

Review of the MIP images confirms the above findings.
IMPRESSION: 1. No evidence of significant pulmonary embolus.
2. No evidence of active pulmonary disease.
3. No acute process demonstrated in the abdomen or pelvis.
4. Diffuse edema and scattered gas collections in the subcutaneous
fat throughout the abdominal wall. This is likely postoperative
change but could also could represent edema or cellulitis. Correlate
with physical examination.
5. Probable focal loculation in the right flank region measuring
x 6.1 cm. This is likely postoperative seroma.

## 2022-06-10 ENCOUNTER — Emergency Department (HOSPITAL_BASED_OUTPATIENT_CLINIC_OR_DEPARTMENT_OTHER)
Admission: EM | Admit: 2022-06-10 | Discharge: 2022-06-10 | Disposition: A | Discharge status: Home / Self Care | Payer: Medicaid Other | Attending: Emergency Medicine | Admitting: Emergency Medicine

## 2022-06-10 ENCOUNTER — Encounter (HOSPITAL_BASED_OUTPATIENT_CLINIC_OR_DEPARTMENT_OTHER): Payer: Self-pay

## 2022-06-10 ENCOUNTER — Other Ambulatory Visit: Payer: Self-pay

## 2022-06-10 ENCOUNTER — Emergency Department (HOSPITAL_BASED_OUTPATIENT_CLINIC_OR_DEPARTMENT_OTHER): Payer: Medicaid Other

## 2022-06-10 DIAGNOSIS — Z331 Pregnant state, incidental: Secondary | ICD-10-CM

## 2022-06-10 DIAGNOSIS — O26891 Other specified pregnancy related conditions, first trimester: Secondary | ICD-10-CM | POA: Insufficient documentation

## 2022-06-10 DIAGNOSIS — E876 Hypokalemia: Secondary | ICD-10-CM | POA: Diagnosis not present

## 2022-06-10 DIAGNOSIS — R102 Pelvic and perineal pain: Secondary | ICD-10-CM | POA: Diagnosis not present

## 2022-06-10 DIAGNOSIS — D649 Anemia, unspecified: Secondary | ICD-10-CM

## 2022-06-10 DIAGNOSIS — O3680X0 Pregnancy with inconclusive fetal viability, not applicable or unspecified: Secondary | ICD-10-CM

## 2022-06-10 DIAGNOSIS — Z3A01 Less than 8 weeks gestation of pregnancy: Secondary | ICD-10-CM | POA: Diagnosis not present

## 2022-06-10 LAB — CBC
HCT: 37.7 % (ref 36.0–46.0)
Hemoglobin: 12.6 g/dL (ref 12.0–15.0)
MCH: 26.1 pg (ref 26.0–34.0)
MCHC: 33.4 g/dL (ref 30.0–36.0)
MCV: 78.1 fL — ABNORMAL LOW (ref 80.0–100.0)
Platelets: 213 10*3/uL (ref 150–400)
RBC: 4.83 MIL/uL (ref 3.87–5.11)
RDW: 14.4 % (ref 11.5–15.5)
WBC: 8.6 10*3/uL (ref 4.0–10.5)
nRBC: 0 % (ref 0.0–0.2)

## 2022-06-10 LAB — COMPREHENSIVE METABOLIC PANEL
ALT: 24 U/L (ref 0–44)
AST: 22 U/L (ref 15–41)
Albumin: 4.1 g/dL (ref 3.5–5.0)
Alkaline Phosphatase: 40 U/L (ref 38–126)
Anion gap: 11 (ref 5–15)
BUN: 7 mg/dL (ref 6–20)
CO2: 21 mmol/L — ABNORMAL LOW (ref 22–32)
Calcium: 10 mg/dL (ref 8.9–10.3)
Chloride: 104 mmol/L (ref 98–111)
Creatinine, Ser: 0.64 mg/dL (ref 0.44–1.00)
GFR, Estimated: >60 mL/min (ref >60)
Glucose, Bld: 83 mg/dL (ref 70–99)
Potassium: 3.3 mmol/L — ABNORMAL LOW (ref 3.5–5.1)
Sodium: 136 mmol/L (ref 135–145)
Total Bilirubin: 0.6 mg/dL (ref 0.3–1.2)
Total Protein: 7.7 g/dL (ref 6.5–8.1)

## 2022-06-10 LAB — URINALYSIS, ROUTINE W REFLEX MICROSCOPIC
Bilirubin Urine: NEGATIVE
Glucose, UA: NEGATIVE mg/dL
Hgb urine dipstick: NEGATIVE
Ketones, ur: 15 mg/dL — AB
Leukocytes,Ua: NEGATIVE
Nitrite: NEGATIVE
Protein, ur: NEGATIVE mg/dL
Specific Gravity, Urine: 1.01 (ref 1.005–1.030)
pH: 6 (ref 5.0–8.0)

## 2022-06-10 LAB — WET PREP, GENITAL
Clue Cells Wet Prep HPF POC: NONE SEEN
Sperm: NONE SEEN
Trich, Wet Prep: NONE SEEN
WBC, Wet Prep HPF POC: <10 (ref <10)
Yeast Wet Prep HPF POC: NONE SEEN

## 2022-06-10 LAB — HCG, QUANTITATIVE, PREGNANCY: hCG, Beta Chain, Quant, S: 40149 m[IU]/mL — ABNORMAL HIGH (ref <5)

## 2022-06-10 LAB — LIPASE, BLOOD: Lipase: 12 U/L (ref 11–51)

## 2022-06-10 MED ORDER — ACETAMINOPHEN 325 MG PO TABS
650.0000 mg | ORAL_TABLET | Freq: Once | ORAL | Status: AC
  Administered 2022-06-10: 650 mg via ORAL
  Filled 2022-06-10: qty 2

## 2022-06-10 NOTE — ED Triage Notes (Signed)
Patient here POV from Home.  Endorses Lower ABD Pain (Mid to Right) that began Yesterday. Intermittent and worsening since. No N/V/D. Some Dysuria.   LMP: 05/06/2022. Positive Pregnancy 06/04/2022.   NAD Noted during Triage. A&Ox4. GCS 15. Ambulatory.

## 2022-06-10 NOTE — Discharge Instructions (Signed)
Get help right away if you have: Spotting or bleeding from your vagina. Severe abdominal cramping or pain. Shortness of breath or chest pain. Any kind of trauma, such as from a fall or a car crash. New or increased pain, swelling, or redness in an arm or leg.

## 2022-06-10 NOTE — ED Provider Notes (Signed)
[place] EMERGENCY DEPARTMENT AT [facility] Provider Note   CSN: [id number] Arrival date & time: 06/10/22  1307     History  Chief Complaint  Patient presents with   Abdominal Pain    Patricia Mckinney is a 37 y.o. female who presents emergency department with chief complaint of pelvic pain.  Patient reports last menstrual period 05/06/2022.  She had a positive pregnancy test after missed menstrual cycle in May.  Patient noted a few days of worsening pelvic pain.  She also had some sense of dysuria.  She has noticed sensation of swelling or mass in the right part of her abdomen which she has never noticed before.  She is G4, P3.   Abdominal Pain      Home Medications Prior to Admission medications   Medication Sig Start Date End Date Taking? Authorizing Provider  acetaminophen (TYLENOL) 325 MG tablet Take 2 tablets (650 mg total) by mouth every 6 (six) hours as needed for mild pain or moderate pain. 07/08/16   Everlene Farrier, PA-C  albuterol (VENTOLIN HFA) 108 (90 Base) MCG/ACT inhaler Inhale 2 puffs into the lungs every 4 (four) hours as needed for wheezing or shortness of breath.    [provider]  enoxaparin (LOVENOX) 40 MG/0.4ML injection Inject 40 mg into the skin See admin instructions. Qd x 7 days 04/03/20   [provider]  ferrous sulfate 325 (65 FE) MG tablet Take 325 mg by mouth daily with breakfast.    [provider]  fluconazole (DIFLUCAN) 150 MG tablet Take 150 mg by mouth once. Patient not taking: No sig reported 04/03/20   [provider]  folic acid (FOLVITE) 400 MCG tablet Take 400 mcg by mouth daily.    [provider]  montelukast (SINGULAIR) 10 MG tablet Take 10 mg by mouth at bedtime. 03/14/20   [provider]  Multiple Vitamin (MULTIVITAMIN WITH MINERALS) TABS tablet Take 1 tablet by mouth daily.    [provider]  omeprazole (PRILOSEC) 20 MG capsule Take 1 capsule (20 mg total) by mouth  daily. Patient taking differently: Take 20 mg by mouth daily as needed (acid reflux). 07/08/16   Everlene Farrier, PA-C  ondansetron (ZOFRAN ODT) 4 MG disintegrating tablet Take 1 tablet (4 mg total) by mouth every 8 (eight) hours as needed for nausea or vomiting. 07/08/16   Everlene Farrier, PA-C  oxyCODONE-acetaminophen (PERCOCET/ROXICET) 5-325 MG tablet Take 1 tablet by mouth every 4 (four) hours as needed for moderate pain or severe pain. 04/03/20   [provider]  sulfamethoxazole-trimethoprim (BACTRIM DS) 800-160 MG tablet Take 1 tablet by mouth See admin instructions. Bid x 7 days 04/03/20   [provider]      Allergies    Shellfish allergy    Review of Systems   Review of Systems  Gastrointestinal:  Positive for abdominal pain.    Physical Exam Updated Vital Signs BP 108/69   Pulse 92   Temp 97.6 F (36.4 C)   Resp 18   Ht 5\' 3"  (1.6 m)   Wt 81.6 kg   LMP 05/01/2022   SpO2 100%   BMI 31.89 kg/m  Physical Exam Vitals and nursing note reviewed. Exam conducted with a chaperone present.  Constitutional:      General: She is not in acute distress.    Appearance: She is well-developed. She is not diaphoretic.  HENT:     Head: Normocephalic and atraumatic.     Right Ear: External ear normal.  Left Ear: External ear normal.     Nose: Nose normal.     Mouth/Throat:     Mouth: Mucous membranes are moist.  Eyes:     General: No scleral icterus.    Conjunctiva/sclera: Conjunctivae normal.  Cardiovascular:     Rate and Rhythm: Normal rate and regular rhythm.     Heart sounds: Normal heart sounds. No murmur heard.    No friction rub. No gallop.  Pulmonary:     Effort: Pulmonary effort is normal. No respiratory distress.     Breath sounds: Normal breath sounds.  Abdominal:     General: Bowel sounds are normal. There is no distension.     Palpations: Abdomen is soft. There is no mass.     Tenderness: There is no abdominal tenderness. There is no guarding.   Genitourinary:    Comments: Sternal female anatomy.  She has a tuberous os with cervical mucoid plug, mucoid discharge, no CMT, no adnexal fullness.  There is a palpable mass on the right side of the pelvis which is tender at the fundus of the uterus.   Musculoskeletal:     Cervical back: Normal range of motion.  Skin:    General: Skin is warm and dry.  Neurological:     Mental Status: She is alert and oriented to person, place, and time.  Psychiatric:        Behavior: Behavior normal.     ED Results / Procedures / Treatments   Labs (all labs ordered are listed, but only abnormal results are displayed) Labs Reviewed  COMPREHENSIVE METABOLIC PANEL - Abnormal; Notable for the following components:      Result Value   Potassium 3.3 (*)    CO2 21 (*)    All other components within normal limits  CBC - Abnormal; Notable for the following components:   MCV 78.1 (*)    All other components within normal limits  URINALYSIS, ROUTINE W REFLEX MICROSCOPIC - Abnormal; Notable for the following components:   Color, Urine COLORLESS (*)    Ketones, ur 15 (*)    All other components within normal limits  HCG, QUANTITATIVE, PREGNANCY - Abnormal; Notable for the following components:   hCG, Beta Chain, Quant, S 40,149 (*)    All other components within normal limits  WET PREP, GENITAL  LIPASE, BLOOD  GC/CHLAMYDIA PROBE AMP ([facility]) NOT AT [facility]    EKG None  Radiology No results found.  Procedures Procedures    Medications Ordered in ED Medications - No data to display  ED Course/ Medical Decision Making/ A&P                             Medical Decision Making Amount and/or Complexity of Data Reviewed Labs: ordered. Radiology: ordered.  Risk OTC drugs.  Patient here with pelvic pain in early pregnancy. Although she does not have any vaginal bleeding differential diagnosis could include ectopic pregnancy, tubo-ovarian abscess, PID, UTI.  I ordered labs which  included patient's beta-hCG appears appropriately elevated to estimated gestational age.  Urine negative for infection, CMP with mild hypokalemia of insignificant value, wet prep unremarkable normal CBC.   Patient's ultrasound shows gestational sac without fetal pole or cardiac activity.  I visualized and interpreted these images.  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  This has enlarged from previous images.  Question if  patient may be having pain from her leiomyoma or that it may currently be outpacing its blood supply she does not have active bleeding.  I discussed the case with Dr. Candelaria Celeste in the [facility] who has secured an outpatient ultrasound for the patient in the next 2-week to rule out blighted ovum.  Given Tylenol and supportive care discussed at discharge.  She appears otherwise appropriate for discharge at this time        Final Clinical Impression(s) / ED Diagnoses Final diagnoses:  None    Rx / DC Orders ED Discharge Orders     None         Arthor Captain, PA-C 06/10/22 Herbie Baltimore    Ernie Avena, MD 06/10/22 Windy Carina    Ernie Avena, MD 06/10/22 1906

## 2022-06-10 NOTE — ED Notes (Signed)
Patient transported to Ultrasound 

## 2022-06-11 LAB — GC/CHLAMYDIA PROBE AMP (~~LOC~~) NOT AT ARMC
Chlamydia: NEGATIVE
Comment: NEGATIVE
Comment: NORMAL
Neisseria Gonorrhea: NEGATIVE

## 2022-06-24 ENCOUNTER — Ambulatory Visit (HOSPITAL_COMMUNITY)
Admission: RE | Admit: 2022-06-24 | Discharge: 2022-06-24 | Disposition: A | Discharge status: Home / Self Care | Payer: Medicaid Other | Source: Ambulatory Visit | Attending: Family Medicine | Admitting: Family Medicine

## 2022-06-24 DIAGNOSIS — D649 Anemia, unspecified: Secondary | ICD-10-CM | POA: Insufficient documentation

## 2023-07-04 ENCOUNTER — Ambulatory Visit
Admission: RE | Admit: 2023-07-04 | Discharge: 2023-07-04 | Disposition: A | Discharge status: Home / Self Care | Source: Ambulatory Visit | Attending: Family Medicine | Admitting: Family Medicine

## 2023-07-04 ENCOUNTER — Encounter: Payer: Self-pay | Admitting: Emergency Medicine

## 2023-07-04 VITALS — BP 132/83 | HR 104 | Temp 98.6°F | Resp 20 | Wt 179.9 lb

## 2023-07-04 DIAGNOSIS — H6992 Unspecified Eustachian tube disorder, left ear: Secondary | ICD-10-CM

## 2023-07-04 DIAGNOSIS — K115 Sialolithiasis: Secondary | ICD-10-CM | POA: Diagnosis not present

## 2023-07-04 MED ORDER — PREDNISONE 20 MG PO TABS: ORAL_TABLET | ORAL | 0 refills | Status: AC

## 2023-07-04 NOTE — ED Triage Notes (Signed)
 Left Ear feels mushy Swollen glands  Abscess under tongue painful - Entered by patient  Pt presets c/o ear fullness x 1 month. Pt states she thought she had an ear infection. Pt also c/o abscess in her mouth x 6 weeks.

## 2023-07-04 NOTE — Discharge Instructions (Signed)
 [facility]   [facility] Emergency dental service in [place], [place]  Address: [address] [place], [place] [address] Phone: [phone]  [facility] [phone] extension [phone] [address]  Dr. Encarnacion [phone] [address]  [employer] [phone] [address] [facility] [place].  [facility] [phone] extension [phone] [place], [place], [address] First come first serve for the first 10 clients.  May do simple extractions only, no wisdom teeth or surgery.  You may try the second for Thursday of the month starting at 6:30 AM.  [facility] You may call the school to see if they are still helping to provide dental care for emergent cases.

## 2023-07-04 NOTE — ED Provider Notes (Signed)
 [facility]  Note:  This document was prepared using Conservation officer, historic buildings and may include unintentional dictation errors.  MRN: [id number] DOB: 12/23/1985  Subjective:   Patricia Mckinney is a 38 y.o. female presenting for 1 month history of persistent left ear fullness, discomfort.  Has a history of asthma, allergies.  Takes Zyrtec daily.  No fever, drainage of pus, bleeding, dizziness, tinnitus, throat pain, cough, rashes.  She has also had 6-week history of an irritated lesion at the base of her mouth under her tongue.  No tenderness, bleeding, drainage.  Has not seen a dental specialist in some time.  No current facility-administered medications for this encounter.  Current Outpatient Medications:    phentermine (ADIPEX-P) 37.5 MG tablet, Take 37.5 mg by mouth daily., Disp: , Rfl:    acetaminophen  (TYLENOL ) 325 MG tablet, Take 2 tablets (650 mg total) by mouth every 6 (six) hours as needed for mild pain or moderate pain., Disp: 60 tablet, Rfl: 0   albuterol  (VENTOLIN  HFA) 108 (90 Base) MCG/ACT inhaler, Inhale 2 puffs into the lungs every 4 (four) hours as needed for wheezing or shortness of breath., Disp: , Rfl:    enoxaparin (LOVENOX) 40 MG/0.4ML injection, Inject 40 mg into the skin See admin instructions. Qd x 7 days, Disp: , Rfl:    ferrous sulfate  325 (65 FE) MG tablet, Take 325 mg by mouth daily with breakfast., Disp: , Rfl:    fluconazole (DIFLUCAN) 150 MG tablet, Take 150 mg by mouth once. (Patient not taking: No sig reported), Disp: , Rfl:    folic acid  (FOLVITE ) 400 MCG tablet, Take 400 mcg by mouth daily., Disp: , Rfl:    montelukast  (SINGULAIR ) 10 MG tablet, Take 10 mg by mouth at bedtime., Disp: , Rfl:    Multiple Vitamin (MULTIVITAMIN WITH MINERALS) TABS tablet, Take 1 tablet by mouth daily., Disp: , Rfl:    omeprazole  (PRILOSEC) 20 MG capsule, Take 1 capsule (20 mg total) by mouth daily. (Patient taking differently: Take 20 mg by mouth  daily as needed (acid reflux).), Disp: 30 capsule, Rfl: 0   ondansetron  (ZOFRAN  ODT) 4 MG disintegrating tablet, Take 1 tablet (4 mg total) by mouth every 8 (eight) hours as needed for nausea or vomiting., Disp: 10 tablet, Rfl: 0   oxyCODONE-acetaminophen  (PERCOCET/ROXICET) 5-325 MG tablet, Take 1 tablet by mouth every 4 (four) hours as needed for moderate pain or severe pain., Disp: , Rfl:    sulfamethoxazole -trimethoprim  (BACTRIM  DS) 800-160 MG tablet, Take 1 tablet by mouth See admin instructions. Bid x 7 days, Disp: , Rfl:    Allergies  Allergen Reactions   Shellfish Allergy Anaphylaxis    Past Medical History:  Diagnosis Date   Allergy    Asthma    Eczema      Past Surgical History:  Procedure Laterality Date   liposurgery      Family History  Problem Relation Age of Onset   Hypertension Maternal Grandmother    Hyperlipidemia Maternal Grandmother    Diabetes Maternal Grandmother     Social History   Tobacco Use   Smoking status: Never    Passive exposure: Never   Smokeless tobacco: Never  Vaping Use   Vaping status: Never Used  Substance Use Topics   Alcohol use: No    Alcohol/week: 0.0 standard drinks of alcohol   Drug use: No    ROS   Objective:   Vitals: BP 132/83 (BP Location: Left Arm)   Pulse ROLLEN)  104   Temp 98.6 F (37 C) (Oral)   Resp 20   Wt 179 lb 14.3 oz (81.6 kg)   LMP 07/04/2023 (Exact Date)   SpO2 98%   Breastfeeding No   BMI 31.87 kg/m   Physical Exam Constitutional:      General: She is not in acute distress.    Appearance: Normal appearance. She is well-developed and normal weight. She is not ill-appearing, toxic-appearing or diaphoretic.  HENT:     Head: Normocephalic and atraumatic.     Right Ear: Tympanic membrane, ear canal and external ear normal. No drainage or tenderness. No middle ear effusion. There is no impacted cerumen. Tympanic membrane is not erythematous or bulging.     Left Ear: Tympanic membrane, ear canal and  external ear normal. No drainage or tenderness.  No middle ear effusion. There is no impacted cerumen. Tympanic membrane is not erythematous or bulging.     Nose: No congestion or rhinorrhea.     Mouth/Throat:     Mouth: Mucous membranes are moist. No oral lesions.     Pharynx: No pharyngeal swelling, oropharyngeal exudate, posterior oropharyngeal erythema or uvula swelling.     Tonsils: No tonsillar exudate or tonsillar abscesses.    Eyes:     General: No scleral icterus.       Right eye: No discharge.        Left eye: No discharge.     Extraocular Movements: Extraocular movements intact.     Right eye: Normal extraocular motion.     Left eye: Normal extraocular motion.     Conjunctiva/sclera: Conjunctivae normal.    Cardiovascular:     Rate and Rhythm: Normal rate.  Pulmonary:     Effort: Pulmonary effort is normal.   Musculoskeletal:     Cervical back: Normal range of motion and neck supple.  Lymphadenopathy:     Cervical: No cervical adenopathy.   Skin:    General: Skin is warm and dry.   Neurological:     General: No focal deficit present.     Mental Status: She is alert and oriented to person, place, and time.   Psychiatric:        Mood and Affect: Mood normal.        Behavior: Behavior normal.     Assessment and Plan :   PDMP not reviewed this encounter.  1. Eustachian tube dysfunction, left   2. Salivary stone    Recommended oral prednisone course for suspected eustachian tube dysfunction.  Patient has minimal tenderness, is afebrile and therefore have low suspicion for an infectious process.  Suspect that patient has sialolithiasis and recommended further consultation with a dental specialist for consideration of biopsy.  Otherwise discussed conservative management for sialolithiasis.  Counseled patient on potential for adverse effects with medications prescribed/recommended today, ER and return-to-clinic precautions discussed, patient verbalized  understanding.    Christopher Savannah, [place] 07/04/23 1130

## 2023-07-22 ENCOUNTER — Encounter: Payer: Self-pay | Admitting: Family Medicine

## 2023-07-22 ENCOUNTER — Ambulatory Visit (INDEPENDENT_AMBULATORY_CARE_PROVIDER_SITE_OTHER): Admitting: Family Medicine

## 2023-07-22 VITALS — BP 111/74 | HR 104 | Ht 63.0 in | Wt 173.2 lb

## 2023-07-22 DIAGNOSIS — Z0000 Encounter for general adult medical examination without abnormal findings: Secondary | ICD-10-CM | POA: Diagnosis not present

## 2023-07-22 DIAGNOSIS — Z13220 Encounter for screening for lipoid disorders: Secondary | ICD-10-CM

## 2023-07-22 DIAGNOSIS — Z130 Encounter for screening for diseases of the blood and blood-forming organs and certain disorders involving the immune mechanism: Secondary | ICD-10-CM

## 2023-07-22 DIAGNOSIS — Z1329 Encounter for screening for other suspected endocrine disorder: Secondary | ICD-10-CM

## 2023-07-22 DIAGNOSIS — Z1159 Encounter for screening for other viral diseases: Secondary | ICD-10-CM

## 2023-07-22 DIAGNOSIS — Z13228 Encounter for screening for other metabolic disorders: Secondary | ICD-10-CM

## 2023-07-22 MED ORDER — ALBUTEROL SULFATE HFA 108 (90 BASE) MCG/ACT IN AERS: 2.0000 | INHALATION_SPRAY | RESPIRATORY_TRACT | 5 refills | Status: AC | PRN

## 2023-07-22 MED ORDER — MONTELUKAST SODIUM 10 MG PO TABS: 10.0000 mg | ORAL_TABLET | Freq: Every day | ORAL | 1 refills | Status: AC

## 2023-07-22 MED ORDER — FLUTICASONE PROPIONATE HFA 110 MCG/ACT IN AERO: 2.0000 | INHALATION_SPRAY | Freq: Two times a day (BID) | RESPIRATORY_TRACT | 12 refills | Status: AC

## 2023-07-22 NOTE — Progress Notes (Signed)
 New Patient Office Visit  Subjective    Patient ID: Patricia Mckinney, female    DOB: February 08, 1985  Age: 38 y.o. MRN: [id number]  CC:  Chief Complaint  Patient presents with   New Patient (Initial Visit)   Annual Exam    Needs physical for school    HPI Patricia Mckinney presents to establish care and for routine annual exam. Patient denies acute complaints although she needs paperwork completed for school.    Outpatient Encounter Medications as of 07/22/2023  Medication Sig   acetaminophen  (TYLENOL ) 325 MG tablet Take 2 tablets (650 mg total) by mouth every 6 (six) hours as needed for mild pain or moderate pain.   fluticasone  (FLOVENT  HFA) 110 MCG/ACT inhaler Inhale 2 puffs into the lungs 2 (two) times daily.   Multiple Vitamin (MULTIVITAMIN WITH MINERALS) TABS tablet Take 1 tablet by mouth daily.   [DISCONTINUED] albuterol  (VENTOLIN  HFA) 108 (90 Base) MCG/ACT inhaler Inhale 2 puffs into the lungs every 4 (four) hours as needed for wheezing or shortness of breath.   albuterol  (VENTOLIN  HFA) 108 (90 Base) MCG/ACT inhaler Inhale 2 puffs into the lungs every 4 (four) hours as needed for wheezing or shortness of breath.   enoxaparin (LOVENOX) 40 MG/0.4ML injection Inject 40 mg into the skin See admin instructions. Qd x 7 days (Patient not taking: Reported on 07/22/2023)   ferrous sulfate  325 (65 FE) MG tablet Take 325 mg by mouth daily with breakfast. (Patient not taking: Reported on 07/22/2023)   fluconazole (DIFLUCAN) 150 MG tablet Take 150 mg by mouth once. (Patient not taking: Reported on 07/22/2023)   folic acid  (FOLVITE ) 400 MCG tablet Take 400 mcg by mouth daily. (Patient not taking: Reported on 07/22/2023)   montelukast  (SINGULAIR ) 10 MG tablet Take 1 tablet (10 mg total) by mouth at bedtime.   omeprazole  (PRILOSEC) 20 MG capsule Take 1 capsule (20 mg total) by mouth daily. (Patient not taking: Reported on 07/22/2023)   ondansetron  (ZOFRAN  ODT) 4 MG disintegrating tablet Take 1 tablet (4  mg total) by mouth every 8 (eight) hours as needed for nausea or vomiting. (Patient not taking: Reported on 07/22/2023)   oxyCODONE-acetaminophen  (PERCOCET/ROXICET) 5-325 MG tablet Take 1 tablet by mouth every 4 (four) hours as needed for moderate pain or severe pain. (Patient not taking: Reported on 07/22/2023)   phentermine (ADIPEX-P) 37.5 MG tablet Take 37.5 mg by mouth daily. (Patient not taking: Reported on 07/22/2023)   predniSONE  (DELTASONE ) 20 MG tablet Take 2 tablets daily with breakfast. (Patient not taking: Reported on 07/22/2023)   sulfamethoxazole -trimethoprim  (BACTRIM  DS) 800-160 MG tablet Take 1 tablet by mouth See admin instructions. Bid x 7 days (Patient not taking: Reported on 07/22/2023)   [DISCONTINUED] montelukast  (SINGULAIR ) 10 MG tablet Take 10 mg by mouth at bedtime. (Patient not taking: Reported on 07/22/2023)   No facility-administered encounter medications on file as of 07/22/2023.    Past Medical History:  Diagnosis Date   Allergy    Asthma    Eczema     Past Surgical History:  Procedure Laterality Date   liposurgery      Family History  Problem Relation Age of Onset   Hypertension Maternal Grandmother    Hyperlipidemia Maternal Grandmother    Diabetes Maternal Grandmother     Social History   Socioeconomic History   Marital status: Single    Spouse name: Not on file   Number of children: Not on file   Years of education: Not on file  Highest education level: Not on file  Occupational History   Not on file  Tobacco Use   Smoking status: Never    Passive exposure: Never   Smokeless tobacco: Never  Vaping Use   Vaping status: Never Used  Substance and Sexual Activity   Alcohol use: No    Alcohol/week: 0.0 standard drinks of alcohol   Drug use: No   Sexual activity: Not on file  Other Topics Concern   Not on file  Social History Narrative   Not on file   Social Drivers of Health   Financial Resource Strain: Not on file  Food Insecurity: No  Food Insecurity (07/22/2023)   Hunger Vital Sign    Worried About Running Out of Food in the Last Year: Never true    Ran Out of Food in the Last Year: Never true  Transportation Needs: No Transportation Needs (07/22/2023)   PRAPARE - Administrator, Civil Service (Medical): No    Lack of Transportation (Non-Medical): No  Physical Activity: Not on file  Stress: Not on file  Social Connections: Not on file  Intimate Partner Violence: Not At Risk (07/22/2023)   Humiliation, Afraid, Rape, and Kick questionnaire    Fear of Current or Ex-Partner: No    Emotionally Abused: No    Physically Abused: No    Sexually Abused: No    Review of Systems  All other systems reviewed and are negative.       Objective   BP 111/74   Pulse (!) 104   Ht 5' 3 (1.6 m)   Wt 173 lb 3.2 oz (78.6 kg)   LMP 07/04/2023 (Exact Date)   SpO2 98%   BMI 30.68 kg/m   Physical Exam Vitals and nursing note reviewed.  Constitutional:      General: She is not in acute distress. HENT:     Head: Normocephalic and atraumatic.     Right Ear: Tympanic membrane, ear canal and external ear normal.     Left Ear: Tympanic membrane, ear canal and external ear normal.     Nose: Nose normal.     Mouth/Throat:     Mouth: Mucous membranes are moist.     Pharynx: Oropharynx is clear.  Eyes:     Conjunctiva/sclera: Conjunctivae normal.     Pupils: Pupils are equal, round, and reactive to light.  Neck:     Thyroid : No thyromegaly.  Cardiovascular:     Rate and Rhythm: Normal rate and regular rhythm.     Heart sounds: Normal heart sounds. No murmur heard. Pulmonary:     Effort: Pulmonary effort is normal. No respiratory distress.     Breath sounds: Normal breath sounds.  Abdominal:     General: There is no distension.     Palpations: Abdomen is soft. There is no mass.     Tenderness: There is no abdominal tenderness.  Musculoskeletal:        General: Normal range of motion.     Cervical back: Normal  range of motion and neck supple.  Skin:    General: Skin is warm and dry.  Neurological:     General: No focal deficit present.     Mental Status: She is alert and oriented to person, place, and time.  Psychiatric:        Mood and Affect: Mood normal.        Behavior: Behavior normal.         Assessment & Plan:   Annual  physical exam -     CMP14+EGFR  Screening for deficiency anemia -     CBC with Differential/Platelet  Screening for lipid disorders -     Lipid panel  Screening for endocrine/metabolic/immunity disorders -     VITAMIN D  25 Hydroxy (Vit-D Deficiency, Fractures) -     Hemoglobin A1c  Need for hepatitis C screening test -     Hepatitis C antibody  Other orders -     Albuterol  Sulfate HFA; Inhale 2 puffs into the lungs every 4 (four) hours as needed for wheezing or shortness of breath.  Dispense: 8 g; Refill: 5 -     Montelukast  Sodium; Take 1 tablet (10 mg total) by mouth at bedtime.  Dispense: 90 tablet; Refill: 1 -     Fluticasone  Propionate HFA; Inhale 2 puffs into the lungs 2 (two) times daily.  Dispense: 1 each; Refill: 12   Form completed for school.   No follow-ups on file.   Tanda Raguel SQUIBB, MD

## 2023-07-23 ENCOUNTER — Ambulatory Visit: Payer: Self-pay | Admitting: Family Medicine

## 2023-07-23 LAB — CMP14+EGFR
ALT: 12 IU/L (ref 0–32)
AST: 12 IU/L (ref 0–40)
Albumin: 4.2 g/dL (ref 3.9–4.9)
Alkaline Phosphatase: 47 IU/L (ref 44–121)
BUN/Creatinine Ratio: 12 (ref 9–23)
BUN: 9 mg/dL (ref 6–20)
Bilirubin Total: 0.7 mg/dL (ref 0.0–1.2)
CO2: 18 mmol/L — ABNORMAL LOW (ref 20–29)
Calcium: 9.9 mg/dL (ref 8.7–10.2)
Chloride: 106 mmol/L (ref 96–106)
Creatinine, Ser: 0.78 mg/dL (ref 0.57–1.00)
Globulin, Total: 2.3 g/dL (ref 1.5–4.5)
Glucose: 84 mg/dL (ref 70–99)
Potassium: 4.1 mmol/L (ref 3.5–5.2)
Sodium: 139 mmol/L (ref 134–144)
Total Protein: 6.5 g/dL (ref 6.0–8.5)
eGFR: 99 mL/min/1.73 (ref >59)

## 2023-07-23 LAB — CBC WITH DIFFERENTIAL/PLATELET
Basophils Absolute: 0 x10E3/uL (ref 0.0–0.2)
Basos: 1 %
EOS (ABSOLUTE): 0.3 x10E3/uL (ref 0.0–0.4)
Eos: 6 %
Hematocrit: 42.8 % (ref 34.0–46.6)
Hemoglobin: 13.6 g/dL (ref 11.1–15.9)
Immature Grans (Abs): 0 x10E3/uL (ref 0.0–0.1)
Immature Granulocytes: 0 %
Lymphocytes Absolute: 1.4 x10E3/uL (ref 0.7–3.1)
Lymphs: 27 %
MCH: 26.7 pg (ref 26.6–33.0)
MCHC: 31.8 g/dL (ref 31.5–35.7)
MCV: 84 fL (ref 79–97)
Monocytes Absolute: 0.4 x10E3/uL (ref 0.1–0.9)
Monocytes: 8 %
Neutrophils Absolute: 2.9 x10E3/uL (ref 1.4–7.0)
Neutrophils: 58 %
Platelets: 211 x10E3/uL (ref 150–450)
RBC: 5.09 x10E6/uL (ref 3.77–5.28)
RDW: 13.3 % (ref 11.7–15.4)
WBC: 5 x10E3/uL (ref 3.4–10.8)

## 2023-07-23 LAB — LIPID PANEL
Chol/HDL Ratio: 3.8 ratio (ref 0.0–4.4)
Cholesterol, Total: 188 mg/dL (ref 100–199)
HDL: 49 mg/dL (ref >39)
LDL Chol Calc (NIH): 115 mg/dL — ABNORMAL HIGH (ref 0–99)
Triglycerides: 137 mg/dL (ref 0–149)
VLDL Cholesterol Cal: 24 mg/dL (ref 5–40)

## 2023-07-23 LAB — HEPATITIS C ANTIBODY: Hep C Virus Ab: NONREACTIVE

## 2023-07-23 LAB — HEMOGLOBIN A1C
Est. average glucose Bld gHb Est-mCnc: 105 mg/dL
Hgb A1c MFr Bld: 5.3 % (ref 4.8–5.6)

## 2023-07-23 LAB — VITAMIN D 25 HYDROXY (VIT D DEFICIENCY, FRACTURES): Vit D, 25-Hydroxy: 49.3 ng/mL (ref 30.0–100.0)

## 2023-11-21 ENCOUNTER — Ambulatory Visit: Payer: Self-pay

## 2024-01-27 ENCOUNTER — Ambulatory Visit: Admitting: Family Medicine
